# Patient Record
Sex: Male | Born: 1970 | Race: White | Hispanic: No | Marital: Single | Smoking: Current every day smoker
Health system: Southern US, Community
[De-identification: ages and names within clinical notes are randomized; demographics above are authoritative.]

## PROBLEM LIST (undated history)

## (undated) DIAGNOSIS — E663 Overweight: Secondary | ICD-10-CM

## (undated) DIAGNOSIS — T7840XA Allergy, unspecified, initial encounter: Secondary | ICD-10-CM

## (undated) DIAGNOSIS — M722 Plantar fascial fibromatosis: Secondary | ICD-10-CM

## (undated) DIAGNOSIS — R7309 Other abnormal glucose: Secondary | ICD-10-CM

## (undated) DIAGNOSIS — N529 Male erectile dysfunction, unspecified: Secondary | ICD-10-CM

## (undated) DIAGNOSIS — J45909 Unspecified asthma, uncomplicated: Secondary | ICD-10-CM

## (undated) DIAGNOSIS — E785 Hyperlipidemia, unspecified: Secondary | ICD-10-CM

## (undated) HISTORY — DX: Hyperlipidemia, unspecified: E78.5

## (undated) HISTORY — DX: Plantar fascial fibromatosis: M72.2

## (undated) HISTORY — DX: Other abnormal glucose: R73.09

## (undated) HISTORY — DX: Overweight: E66.3

## (undated) HISTORY — DX: Male erectile dysfunction, unspecified: N52.9

## (undated) HISTORY — DX: Allergy, unspecified, initial encounter: T78.40XA

## (undated) HISTORY — DX: Unspecified asthma, uncomplicated: J45.909

---

## 2010-10-26 DIAGNOSIS — M7711 Lateral epicondylitis, right elbow: Secondary | ICD-10-CM

## 2010-10-26 HISTORY — DX: Lateral epicondylitis, right elbow: M77.11

## 2016-10-26 HISTORY — PX: BACK SURGERY: SHX140

## 2017-03-24 ENCOUNTER — Ambulatory Visit
Admission: RE | Admit: 2017-03-24 | Discharge: 2017-03-24 | Disposition: A | Discharge status: Home / Self Care | Payer: BLUE CROSS/BLUE SHIELD | Source: Ambulatory Visit | Attending: Family Medicine | Admitting: Family Medicine

## 2017-03-24 ENCOUNTER — Other Ambulatory Visit: Payer: Self-pay | Admitting: Family Medicine

## 2017-03-24 DIAGNOSIS — I700 Atherosclerosis of aorta: Secondary | ICD-10-CM | POA: Diagnosis not present

## 2017-03-24 DIAGNOSIS — R52 Pain, unspecified: Secondary | ICD-10-CM

## 2017-03-24 DIAGNOSIS — M5136 Other intervertebral disc degeneration, lumbar region: Secondary | ICD-10-CM | POA: Insufficient documentation

## 2017-03-24 DIAGNOSIS — M5441 Lumbago with sciatica, right side: Secondary | ICD-10-CM | POA: Diagnosis present

## 2017-03-31 ENCOUNTER — Ambulatory Visit: Payer: BLUE CROSS/BLUE SHIELD | Attending: Family Medicine | Admitting: Physical Therapy

## 2017-03-31 ENCOUNTER — Encounter: Payer: Self-pay | Admitting: Physical Therapy

## 2017-03-31 ENCOUNTER — Ambulatory Visit: Payer: BLUE CROSS/BLUE SHIELD | Admitting: Physical Therapy

## 2017-03-31 DIAGNOSIS — M6281 Muscle weakness (generalized): Secondary | ICD-10-CM | POA: Diagnosis present

## 2017-03-31 DIAGNOSIS — M5441 Lumbago with sciatica, right side: Secondary | ICD-10-CM | POA: Insufficient documentation

## 2017-03-31 DIAGNOSIS — R262 Difficulty in walking, not elsewhere classified: Secondary | ICD-10-CM | POA: Diagnosis present

## 2017-04-01 NOTE — Therapy (Signed)
[facility]INE [address], [place], [address] Phone: [phone]   Fax:  [phone]  Physical Therapy Treatment  Patient Details  Name: Samuel Stevenson MRN: [id number] Date of Birth: [birth year] Referring Provider: Phineas Real MD  Encounter Date: 03/31/2017      PT End of Session - 03/31/17 1830    Visit Number 1   Number of Visits 12   Date for PT Re-Evaluation 05/12/17   PT Start Time 1750   PT Stop Time 1905   PT Time Calculation (min) 75 min   Activity Tolerance Patient tolerated treatment well;Patient limited by pain   Behavior During Therapy [facility] for tasks assessed/performed      Past Medical History:  Diagnosis Date  . Allergy   . Asthma     History reviewed. No pertinent surgical history.  There were no vitals filed for this visit.      Subjective Assessment - 03/31/17 1809    Subjective He reports feeling numbness right foot, muscle spasms in right buttock, posterior thigh and calf muslces. He is unable to sit for any length of time due to pain in right LE, hip, buttock. pain is worsening since initial onset.    Pertinent History bent to left side this past week, 03/29/17 and felt pain/spasm in right buttock. Patient reports he began with 03/05/2017 he sneezed while turning to the right, back seized up. went to chiropractor and was able to work the rest of the day. Did yard work that weekend and began getting worse Sunday evening. Saw chiro that Monday AM and went to work and Tuesday 5/15 was hurting, went to city nurse and then to chiropracter. He was given muscle relaxers, took off Wed. and Thursday because of increased pain. lightning bolt down right leg 5/18 on waking while lying on back. bed is mattress and firm. He called nurse and was seen by chiropracter and called nurse and was then put on prednisone x 9 days. initially it seemed to help and he was oow the next week . He then was put on  methacarbamol, 03/23/17.    Limitations Sitting;House hold activities;Other (comment);Standing  bending, working, driving   How long can you sit comfortably? unable to sit at all   How long can you stand comfortably? only unweighting right LE   How long can you walk comfortably? not comfortably   Diagnostic tests X rays negative   Patient Stated Goals no pain in right LE   Currently in Pain? Yes   Pain Score 5    Pain Location Leg   Pain Orientation Right   Pain Descriptors / Indicators Aching;Spasm;Cramping   Pain Type Acute pain   Pain Onset 1 to 4 weeks ago   Pain Frequency Constant   Aggravating Factors  being still, sitting, standing   Pain Relieving Factors ice   Effect of Pain on Daily Activities limits all daily activities            [facility] PT Assessment - 03/31/17 2357      Assessment   Medical Diagnosis back pain with right sided sciatica   Referring Provider Robinowitz, Zeb Comfort MD   Onset Date/Surgical Date 03/05/17   Hand Dominance Right   Prior Therapy none     Precautions   Precautions None     Restrictions   Weight Bearing Restrictions No     Balance Screen   Has the patient fallen in the past 6 months  No   Has the patient had a decrease in activity level because of a fear of falling?  No   Is the patient reluctant to leave their home because of a fear of falling?  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, yard work, Chemical engineer   Overall Cognitive Status Within Functional Limits for tasks assessed     Observation/Other Assessments   Modified [place] 62%      Sensation   Light Touch Impaired by gross assessment  right foot great toe per patient report     ROM / Strength   AROM / PROM / Strength Strength     Strength   Overall Strength Within functional limits for tasks performed  bilateral LE's major muscle groups WFL tested in sitting     Palpation   Palpation comment spasms palpable right lumbar paraspinal muscles     Special Tests   Lumbar Tests Slump Test     Slump test   Findings Positive   Side Right   Comment reproduced pain rigth LE with knee extension      Objective: Gait: antalgic, slow cadence, short step length bilateral Sitting; posture: sitting on left hip with right LE extended  AROM: lumbar spine: 25% limited flexion with increased pain in lower lumbar spine and difficulty returning to upright posture/position; extension minimal decreased ROM with increased pain end range Position preference extension: patient positioned prone over 2 pillows with result of centralization of symptoms to right lower back, L5-S1 region, ice massage performed to right lower back x ~3 min. With decreased pain reported. Prone on elbows with no increased symptoms; patient performed hip extension right LE with increased pain right lower back, decreased with continued prone lying for additional 3-5 min. Instructed in proper prone to side lying to sit and how to get in/out of car and position with lumbar support to decrease symptoms           PT Education - 03/31/17 1900    Education provided Yes   Education Details POC, posture correction for sitting using lumbar support; positioning to reduce back and right LE symptoms; prone progression; posture awareness   Person(s) Educated Patient   Methods Explanation;Demonstration;Verbal cues   Comprehension Verbalized understanding;Returned demonstration;Verbal cues required             PT Long Term Goals - 03/31/17 1915      PT LONG TERM GOAL #1   Title Patient will demonstrate improved  function with daily tasks and decreased back pain as indicated by MODI score 30% or less  by 05/12/2017   Baseline MODI 62%   Status New     PT LONG TERM GOAL #2   Title Patient will demonstrate improved posture awareness and pain control strategies to allow patient to sit/stand for >30 min. demonstrating improved function for home, work related tasks including driving by 4/69/6295    Baseline unable to sit any length of time without increased right LE symptoms/pain   Status New     PT LONG TERM  GOAL #3   Title Patient will be independent with home program for posture awareness, pain control, progressive exercises by 05/12/2017 to allow patient to transition to self management once discharged from physical therapy   Baseline requires instruciton, guidance and VC   Status New               Plan - 03/31/17 1912    Clinical Impression Statement Patient is a 46 year old male who presents with acute back pain with righ tsided sciatica. He has limitaitons with sitting, driving and bending/flexion activities that limit funtion with all daily tasks at home and is limiting abiltiy to perform personal care and work related activities. He has no knowledge of appriorate pain control strategies, exercises or progression to assist with improving funciton. he may benefit from physical therpay interveniton to assist with pain control and to improve function as he waits for further assessment, MRI.    History and Personal Factors relevant to plan of care: initial onset of pain 03/05/17 with worsening pain from back with radiating symptoms into right LE over the past 3 weeks. unable to sit, perform personal care, sleep or work due to pain/symptoms.    Clinical Presentation Evolving   Clinical Presentation due to: initally had back pain now radiating into right LE with numbness right foot   Clinical Decision Making Low   Rehab Potential Good   Clinical Impairments Affecting Rehab Potential (+)age, acute  condition, active lifestyle, motivated (-) progressive symptoms with worsening and changing symptoms dialy   PT Frequency 2x / week   PT Duration 6 weeks   PT Treatment/Interventions Iontophoresis 4mg /ml Dexamethasone;Electrical Stimulation;Cryotherapy;Moist Heat;Ultrasound;Patient/family education;Neuromuscular re-education;Therapeutic exercise;Manual techniques   PT Next Visit Plan pain control, progress positioning, exercises as tolerated   PT Home Exercise Plan posture awareness, posiitoning, pain control   Consulted and Agree with Plan of Care Patient      Patient will benefit from skilled therapeutic intervention in order to improve the following deficits and impairments:  Pain, Impaired perceived functional ability, Decreased activity tolerance, Increased muscle spasms, Difficulty walking, Decreased range of motion  Visit Diagnosis: Acute right-sided low back pain with right-sided sciatica - Plan: PT plan of care cert/re-cert  Muscle weakness (generalized) - Plan: PT plan of care cert/re-cert  Difficulty in walking, not elsewhere classified - Plan: PT plan of care cert/re-cert     Problem List There are no active problems to display for this patient.   Ado, Gorelik PT 04/02/2017, 1:01 AM  [place] [facility] PHYSICAL AND SPORTS MEDICINE [address] [place], [address] Phone: [phone]   Fax:  [phone]  Name: Samuel Stevenson MRN: [id number] Date of Birth: [birth year]

## 2017-04-05 ENCOUNTER — Ambulatory Visit: Payer: BLUE CROSS/BLUE SHIELD | Admitting: Physical Therapy

## 2017-04-05 ENCOUNTER — Encounter: Payer: Self-pay | Admitting: Physical Therapy

## 2017-04-05 DIAGNOSIS — M5441 Lumbago with sciatica, right side: Secondary | ICD-10-CM | POA: Diagnosis not present

## 2017-04-05 DIAGNOSIS — R262 Difficulty in walking, not elsewhere classified: Secondary | ICD-10-CM

## 2017-04-05 DIAGNOSIS — M6281 Muscle weakness (generalized): Secondary | ICD-10-CM

## 2017-04-05 NOTE — Therapy (Signed)
[facility] [address] [place], [place], [address] Phone: [phone]   Fax:  [phone]  Physical Therapy Treatment  Patient Details  Name: Samuel Stevenson MRN: [id number] Date of Birth: [birth year] Referring Provider: Phineas Realobinowitz, Joseph H. MD  Encounter Date: 04/05/2017      PT End of Session - 04/05/17 1713    Visit Number 2   Number of Visits 12   Date for PT Re-Evaluation 05/12/17   PT Start Time 1646   PT Stop Time 1722   PT Time Calculation (min) 36 min   Activity Tolerance Patient tolerated treatment well;Patient limited by pain   Behavior [facility] Therapy [facility] for tasks assessed/performed      Past Medical History:  Diagnosis Date  . Allergy   . Asthma     History reviewed. No pertinent surgical history.  There were no vitals filed for this visit.      Subjective Assessment - 04/05/17 1652    Subjective Patient reports having increased pain last week and is working as able with back and right LE pain. He continues with numb like feeling in right foot/toes. He reports intermittent symptoms of pain into right LE to calf.    Pertinent History bent to left side this past week, 03/29/17 and felt pain/spasm in right buttock. Patient reports he began with 03/05/2017 he sneezed while turning to the right, back seized up. went to chiropractor and was able to work the rest of the day. Did yard work that weekend and began getting worse Sunday evening. Saw chiro that Monday AM and went to work and Tuesday 5/15 was hurting, went to city nurse and then to chiropracter. He was given muscle relaxers, took off Wed. and Thursday because of increased pain. lightning bolt down right leg 5/18 on waking while lying on back. bed is mattress and firm. He called nurse and was seen by chiropracter and called nurse and was then put on prednisone x 9 days. initially it seemed to help and he was oow the next week . He then was put on  methacarbamol, 03/23/17.    Limitations Sitting;House hold activities;Other (comment);Standing  bending, working, driving   How long can you sit comfortably? unable to sit at all   How long can you stand comfortably? only unweighting right LE   How long can you walk comfortably? not comfortably   Diagnostic tests X rays negative   Patient Stated Goals no pain in right LE   Currently in Pain? Yes   Pain Score 4    Pain Location Buttocks   Pain Orientation Right   Pain Descriptors / Indicators Aching   Pain Type Acute pain   Pain Onset 1 to 4 weeks ago   Pain Frequency Constant      Objective: Gait: antalgic, slow cadence, decreased trunk rotation Palpation: increased warmth over right side lower lumbar L4-5 region, increased hyper sensitivity to palpation right side lower back just superior to gluteal region Posture: guarded  Treatment: Modalities: 20 min. Electrical stimulation: High volt estim.clincial program for muscle spasms (4) electrodes applied to lumbar spine  intensity to tolerance with patient in prone with (1) pillow under abdomen/pelvis and LE's supported by pillow, ice pack applied to lower back in conjunction with estim. (no adverse reactions noted): goal: pain, spasms  Patient performed prone on elbows x 2 min. Following estim. Without increased LE symptoms; patient transferred on/off treatment table with cautious controlled movement with VC for correct posture  to avoid flexion of trunk  Patient response to treatment: decreased soreness by 30% in lumbar spine with ice/estim. Treatment and was able to progress to prone on elbows with less difficulty and no increase in LE symptoms of lower back symptoms. He continued with increased warmth to right of L4-5 even following ice treatment.            PT Education - 04/05/17 1715    Education provided Yes   Education Details re assessed posture, body mechanics for sitting, standing, transfers in/out of car    Person(s)  Educated Patient   Methods Explanation   Comprehension Verbalized understanding             PT Long Term Goals - 03/31/17 1915      PT LONG TERM GOAL #1   Title Patient will demonstrate improved function with daily tasks and decreased back pain as indicated by MODI score 30% or less  by 05/12/2017   Baseline MODI 62%   Status New     PT LONG TERM GOAL #2   Title Patient will demonstrate improved posture awareness and pain control strategies to allow patient to sit/stand for >30 min. demonstrating improved function for home, work related tasks including driving by 5/78/4696    Baseline unable to sit any length of time without increased right LE symptoms/pain   Status New     PT LONG TERM GOAL #3   Title Patient will be independent with home program for posture awareness, pain control, progressive exercises by 05/12/2017 to allow patient to transition to self management once discharged from physical therapy   Baseline requires instruciton, guidance and VC   Status New               Plan - 04/05/17 1714    Clinical Impression Statement Focus of treatment on pain control. Patient demonstrates improvement with centralizing pain from right LE to buttock and lower back however he still has pain radiating down right leg to his calf intermittently and he continues with numblike feeling in his foot.    Rehab Potential Good   Clinical Impairments Affecting Rehab Potential (+)age, acute condition, active lifestyle, motivated (-) progressive symptoms with worsening and changing symptoms dialy   PT Frequency 2x / week   PT Duration 6 weeks   PT Treatment/Interventions Iontophoresis 4mg /ml Dexamethasone;Electrical Stimulation;Cryotherapy;Moist Heat;Ultrasound;Patient/family education;Neuromuscular re-education;Therapeutic exercise;Manual techniques   PT Next Visit Plan pain control, progress positioning, exercises as tolerated   PT Home Exercise Plan posture awareness, posiitoning, pain  control      Patient will benefit from skilled therapeutic intervention in order to improve the following deficits and impairments:  Pain, Impaired perceived functional ability, Decreased activity tolerance, Increased muscle spasms, Difficulty walking, Decreased range of motion  Visit Diagnosis: Acute right-sided low back pain with right-sided sciatica  Muscle weakness (generalized)  Difficulty in walking, not elsewhere classified     Problem List There are no active problems to display for this patient.   Tirth, Cothron PT 04/06/2017, 1:19 PM  [place] [facility] PHYSICAL AND SPORTS MEDICINE [address], [place], [address] Phone: [phone]   Fax:  [phone]  Name: Samuel Stevenson MRN: [id number] Date of Birth: May 17, 1971

## 2017-04-06 ENCOUNTER — Encounter: Payer: BLUE CROSS/BLUE SHIELD | Admitting: Physical Therapy

## 2017-04-07 ENCOUNTER — Ambulatory Visit: Payer: BLUE CROSS/BLUE SHIELD | Admitting: Physical Therapy

## 2017-04-12 ENCOUNTER — Encounter: Payer: BLUE CROSS/BLUE SHIELD | Admitting: Physical Therapy

## 2017-04-12 ENCOUNTER — Ambulatory Visit: Payer: BLUE CROSS/BLUE SHIELD | Admitting: Physical Therapy

## 2017-04-13 ENCOUNTER — Encounter: Payer: BLUE CROSS/BLUE SHIELD | Admitting: Physical Therapy

## 2017-04-14 ENCOUNTER — Ambulatory Visit: Payer: BLUE CROSS/BLUE SHIELD | Admitting: Physical Therapy

## 2017-04-14 ENCOUNTER — Encounter: Payer: BLUE CROSS/BLUE SHIELD | Admitting: Physical Therapy

## 2017-04-15 ENCOUNTER — Ambulatory Visit: Payer: BLUE CROSS/BLUE SHIELD | Admitting: Physical Therapy

## 2017-04-19 ENCOUNTER — Ambulatory Visit: Payer: BLUE CROSS/BLUE SHIELD | Admitting: Physical Therapy

## 2017-04-19 ENCOUNTER — Encounter: Payer: BLUE CROSS/BLUE SHIELD | Admitting: Physical Therapy

## 2017-04-20 ENCOUNTER — Encounter: Payer: BLUE CROSS/BLUE SHIELD | Admitting: Physical Therapy

## 2017-04-21 ENCOUNTER — Encounter: Payer: BLUE CROSS/BLUE SHIELD | Admitting: Physical Therapy

## 2017-04-21 ENCOUNTER — Ambulatory Visit: Payer: BLUE CROSS/BLUE SHIELD | Admitting: Physical Therapy

## 2017-04-22 ENCOUNTER — Encounter: Payer: BLUE CROSS/BLUE SHIELD | Admitting: Physical Therapy

## 2017-04-26 ENCOUNTER — Ambulatory Visit: Payer: BLUE CROSS/BLUE SHIELD | Admitting: Physical Therapy

## 2017-04-26 ENCOUNTER — Encounter: Payer: BLUE CROSS/BLUE SHIELD | Admitting: Physical Therapy

## 2017-04-29 ENCOUNTER — Encounter: Payer: BLUE CROSS/BLUE SHIELD | Admitting: Physical Therapy

## 2018-09-05 ENCOUNTER — Ambulatory Visit: Payer: BLUE CROSS/BLUE SHIELD | Admitting: Podiatry

## 2019-04-11 ENCOUNTER — Other Ambulatory Visit: Payer: Self-pay

## 2019-04-12 MED ORDER — SILDENAFIL CITRATE 20 MG PO TABS: ORAL_TABLET | ORAL | 5 refills | Status: DC

## 2019-05-15 DIAGNOSIS — I788 Other diseases of capillaries: Secondary | ICD-10-CM | POA: Diagnosis not present

## 2019-05-15 DIAGNOSIS — D2262 Melanocytic nevi of left upper limb, including shoulder: Secondary | ICD-10-CM | POA: Diagnosis not present

## 2019-05-15 DIAGNOSIS — L821 Other seborrheic keratosis: Secondary | ICD-10-CM | POA: Diagnosis not present

## 2019-05-15 DIAGNOSIS — D2272 Melanocytic nevi of left lower limb, including hip: Secondary | ICD-10-CM | POA: Diagnosis not present

## 2019-05-15 DIAGNOSIS — D2271 Melanocytic nevi of right lower limb, including hip: Secondary | ICD-10-CM | POA: Diagnosis not present

## 2019-05-15 DIAGNOSIS — D225 Melanocytic nevi of trunk: Secondary | ICD-10-CM | POA: Diagnosis not present

## 2019-05-15 DIAGNOSIS — D2261 Melanocytic nevi of right upper limb, including shoulder: Secondary | ICD-10-CM | POA: Diagnosis not present

## 2019-05-15 DIAGNOSIS — D485 Neoplasm of uncertain behavior of skin: Secondary | ICD-10-CM | POA: Diagnosis not present

## 2019-06-19 DIAGNOSIS — M9903 Segmental and somatic dysfunction of lumbar region: Secondary | ICD-10-CM | POA: Diagnosis not present

## 2019-06-19 DIAGNOSIS — M5386 Other specified dorsopathies, lumbar region: Secondary | ICD-10-CM | POA: Diagnosis not present

## 2019-09-13 ENCOUNTER — Other Ambulatory Visit: Payer: Self-pay

## 2019-09-13 ENCOUNTER — Ambulatory Visit: Payer: Self-pay

## 2019-09-13 DIAGNOSIS — Z0000 Encounter for general adult medical examination without abnormal findings: Secondary | ICD-10-CM

## 2019-09-13 LAB — POCT URINALYSIS DIPSTICK
Bilirubin, UA: NEGATIVE
Blood, UA: NEGATIVE
Glucose, UA: NEGATIVE
Ketones, UA: NEGATIVE
Leukocytes, UA: NEGATIVE
Nitrite, UA: NEGATIVE
Protein, UA: NEGATIVE
Spec Grav, UA: 1.015 (ref 1.010–1.025)
Urobilinogen, UA: 0.2 E.U./dL
pH, UA: 6 (ref 5.0–8.0)

## 2019-09-13 NOTE — Progress Notes (Signed)
Gerarda Fraction, NP-C (Interim Provider) listed as ordering provider for labs because she was working the day Samuel Stevenson came in for Pre-procedural before annual physical.  Physical is scheduled for  09/27/2019, but we don't have a December 2020 Provider Schedule & don't know who he will be seeing.  AMD

## 2019-09-14 LAB — CMP12+LP+TP+TSH+6AC+PSA+CBC…
AST: 53 IU/L — ABNORMAL HIGH (ref 0–40)
Albumin/Globulin Ratio: 2.7 — ABNORMAL HIGH (ref 1.2–2.2)
Albumin: 5.1 g/dL — ABNORMAL HIGH (ref 4.0–5.0)
BUN/Creatinine Ratio: 9 (ref 9–20)
Basophils Absolute: 0.1 10*3/uL (ref 0.0–0.2)
Bilirubin Total: 0.5 mg/dL (ref 0.0–1.2)
Chloride: 100 mmol/L (ref 96–106)
Chol/HDL Ratio: 2 ratio (ref 0.0–5.0)
Cholesterol, Total: 192 mg/dL (ref 100–199)
Estimated CHD Risk: <0.5 times avg. (ref 0.0–1.0)
Free Thyroxine Index: 1.6 (ref 1.2–4.9)
Globulin, Total: 1.9 g/dL (ref 1.5–4.5)
Glucose: 83 mg/dL (ref 65–99)
HDL: 97 mg/dL (ref >39)
Hematocrit: 46.2 % (ref 37.5–51.0)
Immature Grans (Abs): 0 10*3/uL (ref 0.0–0.1)
Immature Granulocytes: 0 %
LDH: 266 IU/L — ABNORMAL HIGH (ref 121–224)
Lymphocytes Absolute: 1.5 10*3/uL (ref 0.7–3.1)
Lymphs: 19 %
MCH: 33.1 pg — ABNORMAL HIGH (ref 26.6–33.0)
MCHC: 33.5 g/dL (ref 31.5–35.7)
MCV: 99 fL — ABNORMAL HIGH (ref 79–97)
Phosphorus: 3.4 mg/dL (ref 2.8–4.1)
Prostate Specific Ag, Serum: 0.6 ng/mL (ref 0.0–4.0)
RDW: 12.5 % (ref 11.6–15.4)
Sodium: 140 mmol/L (ref 134–144)
TSH: 1.8 u[IU]/mL (ref 0.450–4.500)
Total Protein: 7 g/dL (ref 6.0–8.5)
Uric Acid: 5.3 mg/dL (ref 3.7–8.6)
WBC: 7.6 10*3/uL (ref 3.4–10.8)

## 2019-09-14 LAB — CMP12+LP+TP+TSH+6AC+PSA+CBC?
ALT: 49 IU/L — ABNORMAL HIGH (ref 0–44)
Alkaline Phosphatase: 55 IU/L (ref 39–117)
BUN: 6 mg/dL (ref 6–24)
Basos: 1 %
Calcium: 9.3 mg/dL (ref 8.7–10.2)
Creatinine, Ser: 0.65 mg/dL — ABNORMAL LOW (ref 0.76–1.27)
EOS (ABSOLUTE): 0.2 10*3/uL (ref 0.0–0.4)
Eos: 2 %
GFR calc Af Amer: 131 mL/min/{1.73_m2} (ref >59)
GFR calc non Af Amer: 113 mL/min/{1.73_m2} (ref >59)
GGT: 44 IU/L (ref 0–65)
Hemoglobin: 15.5 g/dL (ref 13.0–17.7)
Iron: 100 ug/dL (ref 38–169)
LDL Chol Calc (NIH): 86 mg/dL (ref 0–99)
Monocytes Absolute: 0.5 10*3/uL (ref 0.1–0.9)
Monocytes: 6 %
Neutrophils Absolute: 5.5 10*3/uL (ref 1.4–7.0)
Neutrophils: 72 %
Platelets: 205 10*3/uL (ref 150–450)
Potassium: 4.3 mmol/L (ref 3.5–5.2)
RBC: 4.68 x10E6/uL (ref 4.14–5.80)
T3 Uptake Ratio: 29 % (ref 24–39)
T4, Total: 5.4 ug/dL (ref 4.5–12.0)
Triglycerides: 44 mg/dL (ref 0–149)
VLDL Cholesterol Cal: 9 mg/dL (ref 5–40)

## 2019-09-20 ENCOUNTER — Ambulatory Visit: Payer: Self-pay

## 2019-09-27 ENCOUNTER — Other Ambulatory Visit: Payer: Self-pay

## 2019-09-27 ENCOUNTER — Encounter: Payer: Self-pay | Admitting: Registered Nurse

## 2019-09-27 ENCOUNTER — Ambulatory Visit: Payer: Self-pay | Admitting: Registered Nurse

## 2019-09-27 VITALS — BP 150/78 | HR 80 | Temp 99.3°F | Ht 71.0 in | Wt 205.0 lb

## 2019-09-27 DIAGNOSIS — R7989 Other specified abnormal findings of blood chemistry: Secondary | ICD-10-CM

## 2019-09-27 DIAGNOSIS — M7711 Lateral epicondylitis, right elbow: Secondary | ICD-10-CM

## 2019-09-27 DIAGNOSIS — M7712 Lateral epicondylitis, left elbow: Secondary | ICD-10-CM

## 2019-09-27 DIAGNOSIS — Z6828 Body mass index (BMI) 28.0-28.9, adult: Secondary | ICD-10-CM

## 2019-09-27 DIAGNOSIS — L42 Pityriasis rosea: Secondary | ICD-10-CM

## 2019-09-27 DIAGNOSIS — Z0000 Encounter for general adult medical examination without abnormal findings: Secondary | ICD-10-CM

## 2019-09-27 NOTE — Progress Notes (Signed)
Lost 10 lbs since last physical.

## 2019-09-27 NOTE — Progress Notes (Signed)
Subjective:    Patient ID: Samuel Stevenson, male    DOB: 1971-02-14, 48 y.o.   MRN: [id number]  48y/o caucasian male established patient here for annual physical.  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  Lost 10 lbs in previous year.  Has noticed especially at night bilateral forearm pain with twisting motions/making fist.  Also during the summer having calf cramps during the day and at night.  Patient reported he is alternating water with gatorade and it helps some.  Not happening as often in the past month-rare not daily.  Saw [facility] provider for rash on trunk and told he had pityriasis rosea with herald patch started right hip.  Stated plantar fasciitis has resolved.  Hasn't required mobic daily for back pain anymore and typically aleve 1-2 tabs prn now but has a few left at home to use for bad days doesn't take aleve if he is taking mobic He is aware there are in the same family and shouldn't be taken in the same day.  Denied loss of bowel/bladder control, saddle paresthesias/arm/leg weakness.     Review of Systems  Constitutional: Positive for activity change. Negative for appetite change, chills, diaphoresis, fatigue and fever.  HENT: Negative for trouble swallowing and voice change.   Eyes: Negative for photophobia and visual disturbance.  Respiratory: Negative for cough, choking, chest tightness, shortness of breath, wheezing and stridor.   Cardiovascular: Negative for chest pain, palpitations and leg swelling.  Gastrointestinal: Negative for abdominal distention, abdominal pain, anal bleeding, blood in stool, constipation, diarrhea, nausea and vomiting.  Endocrine: Negative for cold intolerance and heat intolerance.  Genitourinary: Negative for difficulty urinating.  Musculoskeletal: Positive for back pain and myalgias. Negative for arthralgias, gait problem, joint swelling, neck pain and neck stiffness.    Skin: Positive for rash. Negative for color change, pallor and wound.  Allergic/Immunologic: Negative for environmental allergies and food allergies.  Neurological: Positive for weakness. Negative for dizziness, tremors, seizures, syncope, facial asymmetry, speech difficulty, light-headedness, numbness and headaches.  Hematological: Negative for adenopathy. Does not bruise/bleed easily.  Psychiatric/Behavioral: Positive for sleep disturbance. Negative for agitation and confusion.       Objective:   Physical Exam Vitals signs and nursing note reviewed.  Constitutional:      General: He is awake. He is not in acute distress.    Appearance: Normal appearance. He is well-developed, well-groomed and overweight. He is not ill-appearing, toxic-appearing or diaphoretic.  HENT:     Head: Normocephalic and atraumatic.     Jaw: There is normal jaw occlusion.     Salivary Glands: Right salivary gland is not diffusely enlarged or tender. Left salivary gland is not diffusely enlarged or tender.     Right Ear: Hearing, tympanic membrane, ear canal and external ear normal. There is no impacted cerumen.     Left Ear: Hearing, tympanic membrane, ear canal and external ear normal. There is no impacted cerumen.     Nose: Nose normal. No congestion or rhinorrhea.     Right Sinus: No maxillary sinus tenderness or frontal sinus tenderness.     Left Sinus: No maxillary sinus tenderness or frontal sinus tenderness.     Mouth/Throat:     Lips: Pink. No lesions.     Mouth: Mucous membranes are moist.     Dentition: No gum lesions.     Tongue: No lesions. Tongue does not deviate from midline.  Palate: No mass and lesions.     Pharynx: Oropharynx is clear. Uvula midline. No pharyngeal swelling, oropharyngeal exudate, posterior oropharyngeal erythema or uvula swelling.  Eyes:     General: Lids are normal. Vision grossly intact. Gaze aligned appropriately. No allergic shiner, visual field deficit or scleral  icterus.       Right eye: No discharge.        Left eye: No discharge.     Extraocular Movements: Extraocular movements intact.     Conjunctiva/sclera: Conjunctivae normal.     Pupils: Pupils are equal, round, and reactive to light.  Neck:     Musculoskeletal: Normal range of motion and neck supple. Normal range of motion. No edema, erythema, neck rigidity, crepitus, injury, pain with movement, torticollis, spinous process tenderness or muscular tenderness.     Thyroid: No thyroid mass, thyromegaly or thyroid tenderness.     Vascular: No carotid bruit.     Trachea: Trachea normal.  Cardiovascular:     Rate and Rhythm: Normal rate and regular rhythm.     Pulses: Normal pulses.          Radial pulses are 2+ on the right side and 2+ on the left side.     Heart sounds: Normal heart sounds.  Pulmonary:     Effort: Pulmonary effort is normal. No respiratory distress.     Breath sounds: Normal breath sounds and air entry. No stridor, decreased air movement or transmitted upper airway sounds. No decreased breath sounds, wheezing, rhonchi or rales.     Comments: No cough observed in exam room; spoke full sentences without difficulty; wearing cloth mask due to covid 19 pandemic Abdominal:     General: Abdomen is flat. Bowel sounds are normal. There is no distension.     Palpations: Abdomen is soft. There is no mass.     Tenderness: There is no abdominal tenderness. There is no right CVA tenderness, left CVA tenderness, guarding or rebound.     Hernia: No hernia is present.     Comments: Dull to percussion x 4 quads; hypoactive bowel sounds  Musculoskeletal: Normal range of motion.        General: No swelling, tenderness, deformity or signs of injury.     Right lower leg: No edema.     Left lower leg: No edema.     Comments: nontender bilateral forearms patient reported slight pain with supination left forearm normal AROM fingers/wrists/elbow/shoulder/c-spine/t-spine  Lymphadenopathy:      Cervical: No cervical adenopathy.     Right cervical: No superficial cervical adenopathy.    Left cervical: No superficial cervical adenopathy.  Skin:    General: Skin is warm and dry.     Capillary Refill: Capillary refill takes less than 2 seconds.     Coloration: Skin is not ashen, cyanotic, jaundiced, mottled, pale or sallow.     Findings: Rash present. No abrasion, abscess, acne, bruising, burn, ecchymosis, erythema, laceration, lesion, petechiae or wound. Rash is macular and scaling. Rash is not crusting, nodular, papular, purpuric, pustular, urticarial or vesicular.     Nails: There is no clubbing.           Comments: Scattered Pink salmon lesions on trunk anterior and posterior dry fine scale noted  Neurological:     General: No focal deficit present.     Mental Status: He is alert and oriented to person, place, and time. Mental status is at baseline.     Cranial Nerves: No cranial nerve deficit.  Sensory: No sensory deficit.     Motor: No weakness.     Coordination: Coordination normal.     Gait: Gait normal.  Psychiatric:        Mood and Affect: Mood normal.        Speech: Speech normal.        Behavior: Behavior normal. Behavior is cooperative.        Thought Content: Thought content normal.        Judgment: Judgment normal.       Reviewed lab results below with patient refused printout and compared to previous 4 years paper chart [facility] lipids greatly improved; previous history of elevated LFTs/MCV/MCH that resolves and returns discussed probably related to alcohol intake (absorption/nutritional deficiencies can occurs B vitamins needed to make RBCs) and NSAID use; repeat CBC/LFTs in 3 months nonfasting decrease alcohol intake EKG reviewed sinus rhythm stable Glucose 65 - 99 mg/dL 83   Uric Acid 3.7 - 8.6 mg/dL 5.3   Comment:      Therapeutic target for gout patients: <6.0  BUN 6 - 24 mg/dL 6   Creatinine, Ser 9.620.76 - 1.27 mg/dL 9.52WUX0.65Low    GFR calc non Af Amer  >59 mL/min/1.73 113   GFR calc Af Amer >59 mL/min/1.73 131   BUN/Creatinine Ratio 9 - 20 9   Sodium 134 - 144 mmol/L 140   Potassium 3.5 - 5.2 mmol/L 4.3   Chloride 96 - 106 mmol/L 100   Calcium 8.7 - 10.2 mg/dL 9.3   Phosphorus 2.8 - 4.1 mg/dL 3.4   Total Protein 6.0 - 8.5 g/dL 7.0   Albumin 4.0 - 5.0 g/dL 3.2GMWN5.1High    Globulin, Total 1.5 - 4.5 g/dL 1.9   Albumin/Globulin Ratio 1.2 - 2.2 2.7High    Bilirubin Total 0.0 - 1.2 mg/dL 0.5   Alkaline Phosphatase 39 - 117 IU/L 55   LDH 121 - 224 IU/L 266High    AST 0 - 40 IU/L 53High    ALT 0 - 44 IU/L 49High    GGT 0 - 65 IU/L 44   Iron 38 - 169 ug/dL 027100   Cholesterol, Total 100 - 199 mg/dL 253192   Triglycerides 0 - 149 mg/dL 44   HDL >66>39 mg/dL 97   VLDL Cholesterol Cal 5 - 40 mg/dL 9   LDL Chol Calc (NIH) 0 - 99 mg/dL 86   Chol/HDL Ratio 0.0 - 5.0 ratio 2.0   Comment:                 T. Chol/HDL Ratio                        Men Women                 1/2 Avg.Risk 3.4  3.3                   Avg.Risk 5.0  4.4                 2X Avg.Risk 9.6  7.1                 3X Avg.Risk 23.4  11.0   Estimated CHD Risk 0.0 - 1.0 times avg. < 0.5   Comment: The CHD Risk is based on the T. Chol/HDL ratio. Other  factors affect CHD Risk such as hypertension, smoking,  diabetes, severe obesity, and family history of  premature CHD.   TSH 0.450 - 4.500  uIU/mL 1.800   T4, Total 4.5 - 12.0 ug/dL 5.4   T3 Uptake Ratio 24 - 39 % 29   Free Thyroxine Index 1.2 - 4.9 1.6   Prostate Specific Ag, Serum 0.0 - 4.0 ng/mL 0.6   Comment: Roche ECLIA methodology.  According to the American Urological Association, Serum PSA should  decrease and remain at undetectable levels after radical  prostatectomy. The AUA defines biochemical recurrence as an initial  PSA value 0.2 ng/mL or greater followed by a subsequent confirmatory  PSA value 0.2 ng/mL or  greater.  Values obtained with different assay methods or kits cannot be used  interchangeably. Results cannot be interpreted as absolute evidence  of the presence or absence of malignant disease.   WBC 3.4 - 10.8 x10E3/uL 7.6   RBC 4.14 - 5.80 x10E6/uL 4.68   Hemoglobin 13.0 - 17.7 g/dL 15.5   Hematocrit 37.5 - 51.0 % 46.2   MCV 79 - 97 fL 99High    MCH 26.6 - 33.0 pg 33.1High    MCHC 31.5 - 35.7 g/dL 33.5   RDW 11.6 - 15.4 % 12.5   Platelets 150 - 450 x10E3/uL 205   Neutrophils Not Estab. % 72   Lymphs Not Estab. % 19   Monocytes Not Estab. % 6   Eos Not Estab. % 2   Basos Not Estab. % 1   Neutrophils Absolute 1.4 - 7.0 x10E3/uL 5.5   Lymphocytes Absolute 0.7 - 3.1 x10E3/uL 1.5   Monocytes Absolute 0.1 - 0.9 x10E3/uL 0.5   EOS (ABSOLUTE) 0.0 - 0.4 x10E3/uL 0.2   Basophils Absolute 0.0 - 0.2 x10E3/uL 0.1   Immature Granulocytes Not Estab. % 0   Immature Grans (Abs) 0.0 - 0.1 x10E3/uL 0.0   Resulting Agency  [facility]    Narrative Performed by: [facility] Performed at: [address]  [address] [place], [place] [id number]  Lab Director: Rush Farmer MD, Phone: [phone]    Specimen Collected: 09/13/19 08:48 Last Resulted: 09/14/19 08:13            Other Results from 09/13/2019  Result Notes for POCT Urinalysis Dipstick (CPT 69485)  Notes recorded by Olen Cordial, NP on 09/13/2019 at 9:48 AM EST  Normal negative urinalysis will discuss with patient at appt. No blood, bacteria, protein or sugar noted in urine.  POCT Urinalysis Dipstick (CPT 81002) Order: [id number]  Status:  Final result Visible to patient:  No (not released) Next appt:  None Dx:  Routine adult health maintenance  Ref Range & Units 2wk ago  Color, UA  Yellow   Clarity, UA  Clear   Glucose, UA Negative Negative   Bilirubin, UA  Negative   Ketones, UA  Negative   Spec Grav, UA 1.010 - 1.025 1.015   Blood, UA  Negative   pH, UA 5.0 - 8.0 6.0   Protein, UA Negative Negative    Urobilinogen, UA 0.2 or 1.0 E.U./dL 0.2   Nitrite, UA  Negative   Leukocytes, UA Negative Negative   Appearance    Odor             Assessment & Plan:  A-BMI 28, annual physical, alcohol use, pityriasis rosea, lateral epicondylitis bilateral acute  Strap left fitted and distributed from clinic stock to patient left arm for tennis elbow/lateral epicondylitis.  If strap helping right may return to clinic to obtain strap for right forearm from [place].  exitcare handout on tennis elbow and rehab exercises printed and given to  patient. Repetitive motion injury due landscaping work. Discussed rest, nsaid x 2 weeks, stretching, lateral epicondylitis strap use every day and icing 15 minutes TID. Consider formal Physical Therapy referral if no relief with plan of care. Discussed with patient takes longer to resolve when ongoing for months consider orthopedics and steroid injection if no relief with NSAID, rest, ice, strap.  Naproxen max 500mg  po BID OTC.  Patient refused Rx.  Hydrate, hydrate, hydrate, avoid dehydration when taking NSAID. Follow up if no improvement in symptoms with plan of care. Will consider orthopedics and formal PT if fails conservative treatment. Patient verbalized understanding of instructions, agreed with plan of care and had no further questions at this time.     10 lb weight loss over next year; lost 10 lbs in previous year; decreasing alcohol intake alone will probably result in 10 lb loss.  Annual physical again in 1 year fasting labs. Continue exercise 150 minutes per week and dietary fiber 30 grams per day whole grains/fruits/vegetables and lean dairy/protein. Exitcare handout printed and given on risk factors of being overweight to patient. Patient verbalized understanding information/instructions, agreed with plan of care and had no further questions at this time.  Decrease beer to 1 per night or not every night currently up to 4 every night which exceeds recommended 14  per week--health risks/impact will occur with drinking this may beers and can see now with his elevated liver enzymes. Do not stop drinking cold need to decrease then stop to prevent alcohol withdrawal symptoms/crisis.   Repeat LFTs and CBC in 3 months; discussed aleve can also be irritating to liver.  Weight loss recommended also can help improve/resolve elevated liver enzymes if this is fatty liver related in addition to alcohol intake.  Exitcare handout printed and given to patient on alcohol abuse and nutrition and alcoholic liver disease.  Patient verbalized understanding information/instructions, agreed with plan of care and had no further questions at this time.  Discussed typically related to viral illness.  Patient refused Rx for medium potentcy cortisone cream as he is using claritin po am 10mg  and benadryl 25mg  po qhs prn itching and states working well for him, skin dry apply emollient daily e.g. aquaphor/vaseline/eucerin/lotion without fragrance and not in a pump bottle.  Avoid scratching as this can lead to infection/cellulitis.  Patient given printed exitcare handout pityriasis rosea.  Patient to follow up if rash does not resolve or worsening/changing especially with fever/chills/body aches/purulent discharge.  Patient verbalized understanding information/instructions, agreed with plan of care and had no further questions at this time.

## 2019-09-27 NOTE — Patient Instructions (Signed)
Alcoholic Liver Disease Alcoholic liver disease refers to liver damage that is caused by drinking a lot of alcohol over a long period of time. The liver is an organ that:  Helps to divide (break down) and absorb fats and other nutrients in the blood.  Helps to remove harmful substances from the blood.  Makes the parts of the blood that help to form clots and prevent excessive bleeding. Damage may cause the liver to stop working properly. There are three main types of alcoholic liver disease, and they usually occur in the following order. 1. Fatty liver disease. This is considered the early stage of alcoholic liver disease. It is a condition in which too much fat has built up in the liver cells. In many cases, fatty liver disease does not cause any symptoms. It is often diagnosed when lab tests are done for other reasons. 2. Alcoholic hepatitis. This is liver inflammation that decreases the liver's ability to function normally. 3. Alcoholic cirrhosis. Cirrhosis is long-term (chronic) liver injury. Having cirrhosis means that many healthy liver cells have been replaced by scar tissue. This prevents blood from flowing through the liver, which makes it difficult for the liver to function. Symptoms of this condition usually get worse (progress) over time if alcohol use continues. Treatment requires stopping all alcohol intake. What are the causes? Alcoholic liver disease is caused by long-term (chronic) heavy alcohol use. The liver filters alcohol out of the bloodstream. When alcohol gets broken down in the liver, it releases poisonous (toxic) chemicals that damage liver cells. What increases the risk? This condition is more likely to develop in:  Women.  People who have a family history of alcoholic liver disease.  People who have poor nutrition.  People who are obese.  People who drink a lot of alcohol, especially people who often drink a lot during short periods of time (binge drink).   People who have an underlying liver disease such as hepatitis B or hepatitis C.  People who lack certain nutrients, such as folate or thiamine (have nutrient deficiencies). What are the signs or symptoms? Most people do not have symptoms in the early stages of this disease. If early symptoms do develop, they may include:  Loss of appetite.  Nausea and vomiting. Symptoms of moderate disease include:  Loss of appetite.  Nausea and vomiting.  Diarrhea.  Weight loss without trying.  Fatigue.  Yellowing of the skin and the whites of the eyes (jaundice).  Pain and swelling in the abdomen.  Fever. Symptoms of advanced disease include:  Weight loss and muscle loss.  Itchy skin.  Buildup of fluid in the abdomen (ascites).  Swelling of the feet and ankles (edema).  Nosebleeds or bleeding gums.  Bloody bowel movements.  Enlarged fingertips (clubbing).  Trouble concentrating.  Trouble sleeping.  Mood changes.  Agitation.  Confusion. Some people do not have symptoms until the condition becomes severe. Symptoms often get worse right after a period of heavy drinking. How is this diagnosed? This condition may be diagnosed based on:  A physical exam.  Blood tests.  Tests that create detailed images of the body. These may include: ? Liver ultrasound. ? CT scan. ? MRI.  A liver biopsy. For this test, a small sample of liver tissue is removed and checked for signs of damage. How is this treated? The most important part of treatment is to stop drinking alcohol. If you are addicted to alcohol, your health care provider will help you make a plan to  quit. This plan may involve:  Taking medicine to decrease unpleasant symptoms that are caused by stopping or decreasing alcohol use (withdrawal symptoms).  Entering a treatment program to help you stop drinking.  Joining a support group. Treatment for alcoholic liver disease may also include:  Nutritional therapy. Your  health care provider or a diet and nutrition specialist (dietitian) may recommend: ? Eating a healthy diet. ? Taking vitamins. ? Eating foods that contain a lot of zinc or B vitamins such as folate, thiamine, or pyridoxine.  Steroid medicines to reduce liver inflammation. These may be recommended if your disease is severe.  Receiving a donated liver (liver transplant). This is only done in very severe cases, and only for people who have completely stopped drinking and can commit to never drinking alcohol again. Follow these instructions at home:   Do not drink alcohol. Follow your treatment plan, and work with your health care provider as needed.  Consider joining an alcohol support group. These groups can provide emotional support and guidance.  Take over-the-counter and prescription medicines only as told by your health care provider. These include vitamins and supplements.  Do not use medicines or eat foods that contain alcohol unless told by your health care provider.  Follow instructions from your health care provider or dietitian about eating a healthy diet.  Keep all follow-up visits as told by your health care provider. This is important. Contact a health care provider if:  You develop a fever.  Your skin color becomes more yellow, pale, or dark.  You develop headaches. Get help right away if:  You vomit blood.  You have bright red blood in your stool.  You have black, tarry stools.  You have trouble: ? Thinking. ? Walking. ? Balancing. ? Breathing. Summary  Alcoholic liver disease refers to liver damage that is caused by drinking a lot of alcohol over a long period of time.  Symptoms of this condition usually get worse (progress) over time if alcohol use continues.  Your health care provider will do a physical exam and tests to diagnose your condition.  The most important part of treatment is to stop drinking alcohol. Follow your treatment plan, and work  with your health care provider as needed. This information is not intended to replace advice given to you by your health care provider. Make sure you discuss any questions you have with your health care provider. Document Released: 11/02/2014 Document Revised: 01/31/2019 Document Reviewed: 06/24/2017 Elsevier Patient Education  2020 [employer]. Alcohol Abuse and Nutrition Alcohol abuse is any pattern of alcohol consumption that harms your health, relationships, or work. Alcohol abuse can cause poor nutrition (malnutrition or malnourishment) and a lack of nutrients (nutrient deficiencies), which can lead to more complications. Alcohol abuse brings malnutrition and nutrient deficiencies in two ways:  It causes your liver to work abnormally. This affects how your body divides (breaks down) and absorbs nutrients from food.  It causes you to eat poorly. Many people who abuse alcohol do not eat enough carbohydrates, protein, fat, vitamins, and minerals. Nutrients that are commonly lacking (deficient) in people who abuse alcohol include:  Vitamins. ? Vitamin A. This is needed for your vision, metabolism, and ability to fight off infections (immunity). ? B vitamins. These include folate, thiamine, and niacin. These are needed for new cell growth. ? Vitamin C. This plays an important role in wound healing, immunity, and helping your body to absorb iron. ? Vitamin D. This is necessary for your body to  absorb and use calcium. It is produced by your liver, but you can also get it from food and from sun exposure.  Minerals. ? Calcium. This is needed for healthy bones as well as heart and blood vessel (cardiovascular) function. ? Iron. This is important for blood, muscle, and nervous system functioning. ? Magnesium. This plays an important role in muscle and nerve function, and it helps to control blood sugar and blood pressure. ? Zinc. This is important for the normal functioning of your nervous system  and digestive system (gastrointestinal tract). If you think that you have an alcohol dependency problem, or if it is hard to stop drinking because you feel sick or different when you do not use alcohol, talk with your health care provider or another health professional about where to get help. Nutrition is an essential factor in therapy for alcohol abuse. Your health care provider or diet and nutrition specialist (dietitian) will work with you to design a plan that can help to restore nutrients to your body and prevent the risk of complications. What is my plan? Your dietitian may develop a specific eating plan that is based on your condition and any other problems that you have. An eating plan will commonly include:  A balanced diet. ? Grains: 6-8 oz (170-227 g) a day. Examples of 1 oz of whole grains include 1 cup of whole-wheat cereal,  cup of brown rice, or 1 slice of whole-wheat bread. ? Vegetables: 2-3 cups a day. Examples of 1 cup of vegetables include 2 medium carrots, 1 large tomato, or 2 stalks of celery. ? Fruits: 1-2 cups a day. Examples of 1 cup of fruit include 1 large banana, 1 small apple, 8 large strawberries, or 1 large orange. ? Meat and other protein: 5-6 oz (142-170 g) a day.  A cut of meat or fish that is the size of a deck of cards is about 3-4 oz.  Foods that provide 1 oz of protein include 1 egg,  cup of nuts or seeds, or 1 tablespoon (16 g) of peanut butter. ? Dairy: 2-3 cups a day. Examples of 1 cup of dairy include 8 oz (230 mL) of milk, 8 oz (230 g) of yogurt, or 1 oz (44 g) of natural cheese.  Vitamin and mineral supplements. What are tips for following this plan?  Eat frequent meals and snacks. Try to eat 5-6 small meals each day.  Take vitamin or mineral supplements as recommended by your dietitian.  If you are malnourished or if your dietitian recommends it: ? You may follow a high-protein, high-calorie diet. This may include:  2,000-3,000 calories  (kilocalories) a day.  70-100 g (grams) of protein a day. ? You may be directed to follow a diet that includes a complete nutritional supplement beverage. This can help to restore calories, protein, and vitamins to your body. Depending on your condition, you may be advised to consume this beverage instead of your meals or in addition to them.  Certain medicines may cause changes in your appetite, taste, and weight. Work with your health care provider and dietitian to make any changes to your medicines and eating plan.  If you are unable to take in enough food and calories by mouth, your health care provider may recommend a feeding tube. This tube delivers nutritional supplements directly to your stomach. Recommended foods  Eat foods that are high in molecules that prevent oxygen from reacting with your food (antioxidants). These foods include grapes, berries, nuts, green tea,  and dark green or orange vegetables. Eating these can help to prevent some of the stress that is placed on your liver by consuming alcohol.  Eat a variety of fresh fruits and vegetables each day. This will help you to get fiber and vitamins in your diet.  Drink plenty of water and other clear fluids, such as apple juice and broth. Try to drink at least 48-64 oz (1.5-2 L) of water a day.  Include foods fortified with vitamins and minerals in your diet. Commonly fortified foods include milk, orange juice, cereal, and bread.  Eat a variety of foods that are high in omega-3 and omega-6 fatty acids. These include fish, nuts and seeds, and soybeans. These foods may help your liver to recover and may also stabilize your mood.  If you are a vegetarian: ? Eat a variety of protein-rich foods. ? Pair whole grains with plant-based proteins at meals and snack time. For example, eat rice with beans, put peanut butter on whole-grain toast, or eat oatmeal with sunflower seeds. The items listed above may not be a complete list of foods  and beverages you can eat. Contact a dietitian for more information. Foods to avoid  Avoid foods and drinks that are high in fat and sugar. Sugary drinks, salty snacks, and candy contain empty calories. This means that they lack important nutrients such as protein, fiber, and vitamins.  Avoid alcohol. This is the best way to avoid malnutrition due to alcohol abuse. If you must drink, drink measured amounts. Measured drinking means limiting your intake to no more than 1 drink a day for nonpregnant women and 2 drinks a day for men. One drink equals 12 oz (355 mL) of beer, 5 oz (148 mL) of wine, or 1 oz (44 mL) of hard liquor.  Limit your intake of caffeine. Replace drinks like coffee and black tea with decaffeinated coffee and decaffeinated herbal tea. The items listed above may not be a complete list of foods and beverages you should avoid. Contact a dietitian for more information. Summary  Alcohol abuse can cause poor nutrition (malnutrition or malnourishment) and a lack of nutrients (nutrient deficiencies), which can lead to more health problems.  Common nutrient deficiencies include vitamin deficiencies (A, B, C, and D) and mineral deficiencies (calcium, iron, magnesium, and zinc).  Nutrition is an essential factor in therapy for alcohol abuse.  Your health care provider and dietitian can help you to develop a specific eating plan that includes a balanced diet plus vitamin and mineral supplements. This information is not intended to replace advice given to you by your health care provider. Make sure you discuss any questions you have with your health care provider. Document Released: 08/06/2005 Document Revised: 01/31/2019 Document Reviewed: 06/29/2017 Elsevier Patient Education  2020 [employer] Preventing Health Risks of Being Overweight Maintaining a healthy body weight is an important part of your overall health. Your healthy body weight depends on your age, gender, and height. Being  overweight puts you at risk for many health problems, including:  Heart disease.  Diabetes.  Problems sleeping.  Joint problems. You can make changes to your diet and lifestyle to prevent these risks. Consider working with a health care provider or a dietitian to make these changes. What nutrition changes can be made?   Eat only as much as your body needs. In most cases, this is about 2,000 calories a day, but the amount varies depending on your height, gender, and activity level. Ask your health care provider  how many calories you should have each day. Eating more than your body needs on a regular basis can cause you to become overweight or obese.  Eat slowly, and stop eating when you feel full.  Choose healthy foods, including: ? Fruits and vegetables. ? Lean meats. ? Low-fat dairy products. ? High-fiber foods, such as whole grains and beans. ? Healthy snacks like vegetable sticks, a piece of fruit, or a small amount of yogurt or cheese.  Avoid foods and drinks that are high in sugar, salt (sodium), saturated fat, or trans fat. This includes: ? Many desserts such as candy, cookies, and ice cream. ? Soda. ? Fried foods. ? Processed meats such as hot dogs or lunch meats. ? Prepackaged snack foods. What lifestyle changes can be made?   Exercise for at least 150 minutes a week to prevent weight gain, or as often as recommended by your health care provider. Do moderate-intensity exercise, such as brisk walking. ? Spread it out by exercising for 30 minutes 5 days a week, or in short 10-minute bursts several times a day.  Find other ways to stay active and burn calories, such as yard work or a hobby that involves physical activity.  Get at least 8 hours of sleep each night. When you are well-rested, you are more likely to be active and make healthy choices during the day. To sleep better: ? Try to go to bed and wake up at about the same time every day. ? Keep your bedroom dark,  quiet, and cool. ? Make sure that your bed is comfortable. ? Avoid stimulating activities, such as watching television or exercising, for at least one hour before bedtime. Why are these changes important? Eating healthy and being active helps you lose weight and prevent health problems caused by being overweight. Making these changes can also help you manage stress, feel better mentally, and connect with friends and family. What can happen if changes are not made? Being overweight can affect you for your entire life. You may develop joint or bone problems that make it painful or difficult for you to play sports or do activities you enjoy. Being overweight puts stress on your heart and lungs and can lead to medical problems like diabetes, heart disease, and sleeping problems. Where to find support You can get support for preventing health risks of being overweight from:  Your health care provider or a dietitian. They can provide guidance about healthy eating and healthy lifestyle choices.  Weight loss support groups, online or in-person. Where to find more information  MyPlate: FormerBoss[employer]no ? This an online tool that provides personalized recommendations about foods to eat each day.  The Centers for Disease Control and Prevention: http://sharp-hammond.biz/ ? This resource gives tips for managing weight and having an active lifestyle. Summary  To prevent unhealthy weight gain, it is important to maintain a healthy diet high in vegetables and whole grains, exercise regularly, and get at least 8 hours of sleep each night.  Making these changes helps prevent many long-term (chronic) health conditions that can shorten your life, such as diabetes, heart disease, and stroke. This information is not intended to replace advice given to you by your health care provider. Make sure you discuss any questions you have with your health care provider. Document Released: 09/08/2017 Document Revised:  10/15/2017 Document Reviewed: 09/08/2017 Elsevier Patient Education  2020 [place] Ask your health care provider which exercises are safe for you. Do exercises exactly as told by  your health care provider and adjust them as directed. It is normal to feel mild stretching, pulling, tightness, or discomfort as you do these exercises. Stop right away if you feel sudden pain or your pain gets worse. Do not begin these exercises until told by your health care provider. Stretching and range-of-motion exercises These exercises warm up your muscles and joints and improve the movement and flexibility of your elbow. These exercises also help to relieve pain, numbness, and tingling. Wrist flexion, assisted  1. Straighten your left / right elbow in front of you with your palm facing down toward the floor. ? If told by your health care provider, bend your left / right elbow to a 90-degree angle (right angle) at your side. 2. With your other hand, gently push over the back of your left / right hand so your fingers point toward the floor (flexion). Stop when you feel a gentle stretch on the back of your forearm. 3. Hold this position for _____5-15_____ seconds. Repeat ___3_______ times. Complete this exercise __3________ times a day. Wrist extension, assisted  1. Straighten your left / right elbow in front of you with your palm facing up toward the ceiling. ? If told by your health care provider, bend your left / right elbow to a 90-degree angle (right angle) at your side. 2. With your other hand, gently pull your left / right hand and fingers toward the floor (extension). Stop when you feel a gentle stretch on the palm side of your forearm. 3. Hold this position for ____5-15______ seconds. Repeat ______3____ times. Complete this exercise ____3______ times a day. Assisted forearm rotation, supination 1. Sit or stand with your left / right elbow bent to a 90-degree angle (right angle) at  your side. 2. Using your uninjured hand, turn (rotate) your left / right palm up toward the ceiling (supination) until you feel a gentle stretch along the inside of your forearm. 3. Hold this position for ______5-15____ seconds. Repeat ____3______ times. Complete this exercise ____3______ times a day. Assisted forearm rotation, pronation 1. Sit or stand with your left / right elbow bent to a 90-degree angle (right angle) at your side. 2. Using your uninjured hand, rotate your left / right palm down toward the floor (pronation) until you feel a gentle stretch along the outside of your forearm. 3. Hold this position for ___5-15_______ seconds. Repeat ___3_______ times. Complete this exercise _____3_____ times a day. Strengthening exercises These exercises build strength and endurance in your forearm and elbow. Endurance is the ability to use your muscles for a long time, even after they get tired. Radial deviation  1. Stand with a ____no______ weight or a hammer in your left / right hand. Or, sit while holding a rubber exercise band or tubing, with your left / right forearm supported on a table or countertop. ? If you are standing, position your forearm so that your thumb is facing forward. If you are sitting, position your forearm so that the thumb is facing the ceiling. This is the neutral position. 2. Raise your hand upward in front of you so your thumb moves toward the ceiling (radial deviation), or pull up on the rubber tubing. Keep your forearm and elbow still while you move your wrist only. 3. Hold this position for ____5-15______ seconds. 4. Slowly return to the starting position. Repeat _____3_____ times. Complete this exercise ______3____ times a day. Wrist extension, eccentric 1. Sit with your left / right forearm palm-down and supported on a table or  other surface. Let your left / right wrist extend over the edge of the surface. 2. Hold a ____no______ weight or a piece of exercise band  or tubing in your left / right hand. ? If using a rubber exercise band or tubing, hold the other end of the tubing with your other hand. 3. Use your uninjured hand to move your left / right hand up toward the ceiling. 4. Take your uninjured hand away and slowly return to the starting position using only your left / right hand. Lowering your arm under tension is called eccentric extension. Repeat __________ times. Complete this exercise __________ times a day. Wrist extension Do not do this exercise if it causes pain at the outside of your elbow. Only do this exercise once instructed by your health care provider. 1. Sit with your left / right forearm supported on a table or other surface and your palm turned down toward the floor. Let your left / right wrist extend over the edge of the surface. 2. Hold a __________ weight or a piece of rubber exercise band or tubing. ? If you are using a rubber exercise band or tubing, hold the band or tubing in place with your other hand to provide resistance. 3. Slowly bend your wrist so your hand moves up toward the ceiling (extension). Move only your wrist, keeping your forearm and elbow still. 4. Hold this position for __5-15________ seconds. 5. Slowly return to the starting position. Repeat ______3____ times. Complete this exercise __3________ times a day. Forearm rotation, supination To do this exercise, you will need a lightweight hammer or rubber mallet. 1. Sit with your left / right forearm supported on a table or other surface. Bend your elbow to a 90-degree angle (right angle). Position your forearm so that your palm is facing down toward the floor, with your hand resting over the edge of the table. 2. Hold a hammer in your left / right hand. ? To make this exercise easier, hold the hammer near the head of the hammer. ? To make this exercise harder, hold the hammer near the end of the handle. 3. Without moving your wrist or elbow, slowly rotate your  forearm so your palm faces up toward the ceiling (supination). 4. Hold this position for ___5-15_______ seconds. 5. Slowly return to the starting position. Repeat ___3_______ times. Complete this exercise ___3_______ times a day. Shoulder blade squeeze 1. Sit in a stable chair or stand with good posture. If you are sitting down, do not let your back touch the back of the chair. 2. Your arms should be at your sides with your elbows bent to a 90-degree angle (right angle). Position your forearms so that your thumbs are facing the ceiling (neutral position). 3. Without lifting your shoulders up, squeeze your shoulder blades tightly together. 4. Hold this position for ___5-15_______ seconds. 5. Slowly release and return to the starting position. Repeat ___3_______ times. Complete this exercise ____3______ times a day. This information is not intended to replace advice given to you by your health care provider. Make sure you discuss any questions you have with your health care provider. Document Released: 10/12/2005 Document Revised: 02/02/2019 Document Reviewed: 12/06/2018 Elsevier Patient Education  2020 [employer] Tennis Elbow  Tennis elbow (lateral epicondylitis) is inflammation of tendons in your outer forearm, near your elbow. Tendons are tissues that connect muscle to bone. When you have tennis elbow, inflammation affects the tendons that you use to bend your wrist and move your hand up.  Inflammation occurs in the lower part of the upper arm bone (humerus), where the tendons connect to the bone (lateral epicondyle). Tennis elbow often affects people who play tennis, but anyone may get the condition from repeatedly extending the wrist or turning the forearm. What are the causes? This condition is usually caused by repeatedly extending the wrist, turning the forearm, and using the hands. It can result from sports or work that requires repetitive forearm movements. In some cases, it may be  caused by a sudden injury. What increases the risk? You are more likely to develop tennis elbow if you play tennis or another racket sport. You also have a higher risk if you frequently use your hands for work. Besides people who play tennis, others at greater risk include:  Musicians.  Carpenters, painters, and plumbers.  Cooks.  Cashiers.  People who work in [employer].  Holiday representative workers.  Butchers.  People who use computers. What are the signs or symptoms? Symptoms of this condition include:  Pain and tenderness in the forearm and the outer part of the elbow. Pain may be felt only when using the arm, or it may be there all the time.  A burning feeling that starts in the elbow and spreads down the forearm.  A weak grip in the hand. How is this diagnosed? This condition may be diagnosed based on:  Your symptoms and medical history.  A physical exam.  X-rays.  MRI. How is this treated? Resting and icing your arm is often the first treatment. Your health care provider may also recommend:  Medicines to reduce pain and inflammation. These may be in the form of a pill, topical gels, or shots of a steroid medicine (cortisone).  An elbow strap to reduce stress on the area.  Physical therapy. This may include massage or exercises.  An elbow brace to restrict the movements that cause symptoms. If these treatments do not help relieve your symptoms, your health care provider may recommend surgery to remove damaged muscle and reattach healthy muscle to bone. Follow these instructions at home: Activity  Rest your elbow and wrist and avoid activities that cause symptoms, as told by your health care provider.  Do physical therapy exercises as instructed.  If you lift an object, lift it with your palm facing up. This reduces stress on your elbow. Lifestyle  If your tennis elbow is caused by sports, check your equipment and make sure that: ? You are using it correctly.  ? It is the best fit for you.  If your tennis elbow is caused by work or computer use, take frequent breaks to stretch your arm. Talk with your manager about ways to manage your condition at work. If you have a brace:  Wear the brace or strap as told by your health care provider. Remove it only as told by your health care provider.  Loosen the brace if your fingers tingle, become numb, or turn cold and blue.  Keep the brace clean.  If the brace is not waterproof, ask if you may remove it for bathing. If you must keep the brace on while bathing: ? Do not let it get wet. ? Cover it with a watertight covering when you take a bath or a shower. General instructions   If directed, put ice on the painful area: ? Put ice in a plastic bag. ? Place a towel between your skin and the bag. ? Leave the ice on for 20 minutes, 2-3 times a day.  Take over-the-counter and prescription medicines only as told by your health care provider.  Keep all follow-up visits as told by your health care provider. This is important. Contact a health care provider if:  You have pain that gets worse or does not get better with treatment.  You have numbness or weakness in your forearm, hand, or fingers. Summary  Tennis elbow (lateral epicondylitis) is inflammation of tendons in your outer forearm, near your elbow.  Common symptoms include pain and tenderness in your forearm and the outer part of your elbow.  This condition is usually caused by repeatedly extending your wrist, turning your forearm, and using your hands.  The first treatment is often resting and icing your arm to relieve symptoms. Further treatment may include taking medicine, getting physical therapy, wearing a brace or strap, or having surgery. This information is not intended to replace advice given to you by your health care provider. Make sure you discuss any questions you have with your health care provider. Document Released: 10/12/2005  Document Revised: 07/08/2018 Document Reviewed: 07/27/2017 Elsevier Patient Education  2020 Else[employer] Inc. Pityriasis Rosea Pityriasis rosea is a rash that usually appears on the chest, abdomen, and back. It may also appear on the upper arms and upper legs. It usually begins as a single patch, and then more patches start to develop. The rash may cause mild itching, but it normally does not cause other problems. It usually goes away without treatment. However, it may take weeks or months for the rash to go away completely. What are the causes? The cause of this condition is not known. The condition does not spread from person to person (is not contagious). What increases the risk? This condition is more likely to develop in:  Persons aged 10-35 years.  Pregnant women. It is more common in the spring and fall seasons. What are the signs or symptoms? The main symptom of this condition is a rash.  The rash usually begins with a single oval patch that is larger than the ones that follow. This is called a herald patch. It generally appears a week or more before the rest of the rash appears.  When more patches start to develop, they spread quickly on the chest, abdomen, back, arms, and legs. These patches are smaller than the first one.  The patches that make up the rash are usually oval-shaped and pink or red in color. They are usually flat but may sometimes be raised so that they can be felt with a finger. They may also be finely crinkled and have a scaly ring around the edge. Some people may have mild itching and nonspecific symptoms, such as:  Nausea.  Loss of appetite.  Difficulty concentrating.  Headache.  Irritability.  Sore throat.  Mild fever. How is this diagnosed? This condition may be diagnosed based on:  Your medical history and a physical exam.  Tests to rule out other causes. This may include blood tests or a test in which a small sample of skin is removed from the  rash (biopsy) and checked in a lab. How is this treated?     Treatment is not usually needed for this condition. The rash will often go away on its own in 4-8 weeks. In some cases, a health care provider may recommend or prescribe medicine to reduce itching. Follow these instructions at home:  Take or apply over-the-counter and prescription medicines only as told by your health care provider.  Avoid scratching the affected areas of  skin.  Do not take hot baths or use a sauna. Use only warm water when bathing or showering. Heat can increase itching. Adding cornstarch to your bath may help to relieve the itching.  Avoid exposure to the sun and other sources of UV light, such as tanning beds, as told by your health care provider. UV light may help the rash go away but may cause unwanted changes in skin color.  Keep all follow-up visits as told by your health care provider. This is important. Contact a health care provider if:  Your rash does not go away in 8 weeks.  Your rash gets much worse.  You have a fever.  You have swelling or pain in the rash area.  You have fluid, blood, or pus coming from the rash area. Summary  Pityriasis rosea is a rash that usually appears on the trunk of the body. It can also appear on the upper arms and upper legs.  The rash usually begins with a single oval patch (herald patch) that appears a week or more before the rest of the rash appears. The herald patch is larger than the ones that follow.  The rash may cause mild itching, but it usually does not cause other problems. It usually goes away without treatment in 4-8 weeks.  In some cases, a health care provider may recommend or prescribe medicine to reduce itching. This information is not intended to replace advice given to you by your health care provider. Make sure you discuss any questions you have with your health care provider. Document Released: 11/18/2001 Document Revised: 10/11/2017  Document Reviewed: 10/11/2017 Elsevier Patient Education  2020 [employer].

## 2019-10-12 ENCOUNTER — Encounter: Payer: Self-pay | Admitting: Physician Assistant

## 2019-11-23 ENCOUNTER — Encounter: Payer: Self-pay | Admitting: Internal Medicine

## 2019-11-23 ENCOUNTER — Other Ambulatory Visit: Payer: Self-pay | Admitting: Internal Medicine

## 2019-11-23 DIAGNOSIS — N529 Male erectile dysfunction, unspecified: Secondary | ICD-10-CM

## 2019-11-23 NOTE — Telephone Encounter (Signed)
Paper chart review conducted with RN Dobbins via telephone at 1530 today.  Dr Dorris Fetch last filled sildenafil 20mg  1-3 tabs po prn #30 RF5 Jun 2020.  Last office visit with me Sep 27 2019.  Electronic Rx sent to his pharmacy of choice sildenafil 20mg  take 1-3 tabs 30 min to 4 hours prior to sexual intercourse once a day prn #30 RF5.

## 2019-12-26 NOTE — Progress Notes (Signed)
Patient comes in today for 3 month follow up labs requested by Albina Billet, PA-C.

## 2019-12-27 ENCOUNTER — Other Ambulatory Visit: Payer: Self-pay

## 2019-12-27 DIAGNOSIS — R7989 Other specified abnormal findings of blood chemistry: Secondary | ICD-10-CM

## 2019-12-28 LAB — HEPATIC FUNCTION PANEL
ALT: 26 IU/L (ref 0–44)
AST: 34 IU/L (ref 0–40)
Albumin: 4.6 g/dL (ref 4.0–5.0)
Alkaline Phosphatase: 55 IU/L (ref 39–117)
Bilirubin Total: 0.4 mg/dL (ref 0.0–1.2)
Bilirubin, Direct: 0.15 mg/dL (ref 0.00–0.40)
Total Protein: 6.8 g/dL (ref 6.0–8.5)

## 2019-12-28 LAB — CBC WITH DIFFERENTIAL/PLATELET
Basophils Absolute: 0.1 10*3/uL (ref 0.0–0.2)
Basos: 1 %
EOS (ABSOLUTE): 0.4 10*3/uL (ref 0.0–0.4)
Eos: 4 %
Hematocrit: 42.9 % (ref 37.5–51.0)
Hemoglobin: 14.9 g/dL (ref 13.0–17.7)
Immature Grans (Abs): 0 10*3/uL (ref 0.0–0.1)
Immature Granulocytes: 0 %
Lymphocytes Absolute: 2.3 10*3/uL (ref 0.7–3.1)
Lymphs: 23 %
MCH: 34.2 pg — ABNORMAL HIGH (ref 26.6–33.0)
MCHC: 34.7 g/dL (ref 31.5–35.7)
MCV: 98 fL — ABNORMAL HIGH (ref 79–97)
Monocytes Absolute: 0.6 10*3/uL (ref 0.1–0.9)
Monocytes: 6 %
Neutrophils Absolute: 6.9 10*3/uL (ref 1.4–7.0)
Neutrophils: 66 %
Platelets: 237 10*3/uL (ref 150–450)
RBC: 4.36 x10E6/uL (ref 4.14–5.80)
RDW: 12.4 % (ref 11.6–15.4)
WBC: 10.3 10*3/uL (ref 3.4–10.8)

## 2019-12-29 DIAGNOSIS — U071 COVID-19: Secondary | ICD-10-CM | POA: Diagnosis not present

## 2019-12-29 DIAGNOSIS — Z20828 Contact with and (suspected) exposure to other viral communicable diseases: Secondary | ICD-10-CM | POA: Diagnosis not present

## 2020-01-03 ENCOUNTER — Ambulatory Visit: Payer: Self-pay

## 2020-01-09 ENCOUNTER — Telehealth: Payer: Self-pay

## 2020-01-09 NOTE — Telephone Encounter (Signed)
Samuel Stevenson called to cancel appt scheduled for 01/11/20 with Durward Parcel, PA-C to review labs previously ordered by Albina Billet, NP-C.  Asked if he saw Tina's message dated 12/28/19 in his mychart & he said he hadn't seen it.  Informed him he can see the actual lab results in mychart & there's a message from [place] to him.    Reviewed the lab results with him & the questions Inetta Fermo sent him. 1.  Rash & Elbow Pain - resolved 2.  Alcohol consumption -  decreased 3.  Tylenol/Ibuprofen - decreased  4.  States he's trying to stay more active around the house & has been walking some.  He was pleased to know that changes he made in his life showed in his lab results.  States he will call back if he has any questions.  Chose not to reschedule appt at this time.  AMD

## 2020-01-09 NOTE — Telephone Encounter (Signed)
Noted patient cancelled follow up appt with PA Katrinka Blazing but rash and elbow pain have resolved and he continues to make healthy TLC.  RN Paschal Dopp reviewed lab results and note/instructions with patient as he had not reviewed mychart message.

## 2020-01-11 ENCOUNTER — Ambulatory Visit: Payer: Self-pay

## 2020-01-15 ENCOUNTER — Encounter: Payer: Self-pay | Admitting: Registered Nurse

## 2020-01-15 ENCOUNTER — Telehealth: Payer: Self-pay | Admitting: Registered Nurse

## 2020-01-15 NOTE — Telephone Encounter (Signed)
Noted positive SARS Covid 19 test 12/29/2019 [facility]  Please follow up with patient to ensure covid symptoms resolved or if having any long covid symptoms.  Follow up labs 12/27/2019 for elevated LFTs improved returned to normal with TLC.  RN Paschal Dopp reviewed results with patient for 12/27/2019.

## 2020-01-17 NOTE — Telephone Encounter (Signed)
Tried to call Mick & went straight to voice mail.  Left call back message.  AMD

## 2020-01-22 ENCOUNTER — Telehealth: Payer: Self-pay

## 2020-01-22 NOTE — Telephone Encounter (Signed)
Called Mick to see if covid symptoms resolved.  States he never had any covid symptoms.  York Spaniel his daughter was exposed to someone at work who tested positive.  She wasn't having symptoms, but was tested because of the exposure & her covid test was positive.  He went to get a covid test because he was around his daughter & his test was positive.  States neither of them developed symptoms.  Discussed locations for covid vaccine.  He does want the vaccine & plans to get it.  AMD

## 2020-01-23 NOTE — Telephone Encounter (Signed)
Noted no covid symptoms only positive test.

## 2020-02-09 NOTE — Telephone Encounter (Signed)
Next patient labs to be with annual physical as LFTs WNL and CBC almost normal/improved.  Continue lower alcohol intake/exercise and weight loss.

## 2020-05-14 DIAGNOSIS — D2262 Melanocytic nevi of left upper limb, including shoulder: Secondary | ICD-10-CM | POA: Diagnosis not present

## 2020-05-14 DIAGNOSIS — L821 Other seborrheic keratosis: Secondary | ICD-10-CM | POA: Diagnosis not present

## 2020-05-14 DIAGNOSIS — D2261 Melanocytic nevi of right upper limb, including shoulder: Secondary | ICD-10-CM | POA: Diagnosis not present

## 2020-05-14 DIAGNOSIS — D485 Neoplasm of uncertain behavior of skin: Secondary | ICD-10-CM | POA: Diagnosis not present

## 2020-05-14 DIAGNOSIS — D2272 Melanocytic nevi of left lower limb, including hip: Secondary | ICD-10-CM | POA: Diagnosis not present

## 2020-05-14 DIAGNOSIS — D2271 Melanocytic nevi of right lower limb, including hip: Secondary | ICD-10-CM | POA: Diagnosis not present

## 2020-05-14 DIAGNOSIS — D225 Melanocytic nevi of trunk: Secondary | ICD-10-CM | POA: Diagnosis not present

## 2020-07-13 DIAGNOSIS — R1031 Right lower quadrant pain: Secondary | ICD-10-CM | POA: Diagnosis not present

## 2020-07-13 DIAGNOSIS — N50811 Right testicular pain: Secondary | ICD-10-CM | POA: Diagnosis not present

## 2020-07-15 ENCOUNTER — Other Ambulatory Visit: Payer: Self-pay

## 2020-07-15 ENCOUNTER — Other Ambulatory Visit: Payer: Self-pay | Admitting: Family Medicine

## 2020-07-15 ENCOUNTER — Ambulatory Visit
Admission: RE | Admit: 2020-07-15 | Discharge: 2020-07-15 | Disposition: A | Discharge status: Home / Self Care | Payer: 59 | Source: Ambulatory Visit | Attending: Family Medicine | Admitting: Family Medicine

## 2020-07-15 DIAGNOSIS — N50811 Right testicular pain: Secondary | ICD-10-CM | POA: Diagnosis not present

## 2020-07-15 DIAGNOSIS — R1031 Right lower quadrant pain: Secondary | ICD-10-CM | POA: Diagnosis not present

## 2020-07-15 DIAGNOSIS — I861 Scrotal varices: Secondary | ICD-10-CM | POA: Diagnosis not present

## 2020-08-01 ENCOUNTER — Ambulatory Visit: Payer: Self-pay

## 2020-08-01 ENCOUNTER — Other Ambulatory Visit: Payer: Self-pay

## 2020-08-01 DIAGNOSIS — Z0000 Encounter for general adult medical examination without abnormal findings: Secondary | ICD-10-CM

## 2020-08-01 LAB — POCT URINALYSIS DIPSTICK
Bilirubin, UA: NEGATIVE
Blood, UA: NEGATIVE
Glucose, UA: NEGATIVE
Ketones, UA: POSITIVE
Leukocytes, UA: NEGATIVE
Nitrite, UA: NEGATIVE
Protein, UA: NEGATIVE
Spec Grav, UA: 1.02 (ref 1.010–1.025)
Urobilinogen, UA: 0.2 E.U./dL
pH, UA: 6 (ref 5.0–8.0)

## 2020-08-01 NOTE — Progress Notes (Signed)
Scheduled to complete physical 08/08/20 with Bridget Hartshorn, PA-C.  EKG reviewed by Durward Parcel, PA-C  AMD

## 2020-08-02 LAB — CMP12+LP+TP+TSH+6AC+PSA+CBC…
ALT: 18 IU/L (ref 0–44)
AST: 22 IU/L (ref 0–40)
Albumin/Globulin Ratio: 2 (ref 1.2–2.2)
Albumin: 4.8 g/dL (ref 3.8–4.9)
Alkaline Phosphatase: 52 IU/L (ref 44–121)
BUN/Creatinine Ratio: 7 — ABNORMAL LOW (ref 9–20)
BUN: 6 mg/dL (ref 6–24)
Basophils Absolute: 0.1 10*3/uL (ref 0.0–0.2)
Basos: 1 %
Bilirubin Total: 0.6 mg/dL (ref 0.0–1.2)
Calcium: 9.4 mg/dL (ref 8.7–10.2)
Chloride: 100 mmol/L (ref 96–106)
Chol/HDL Ratio: 2.7 ratio (ref 0.0–5.0)
Cholesterol, Total: 202 mg/dL — ABNORMAL HIGH (ref 100–199)
Creatinine, Ser: 0.82 mg/dL (ref 0.76–1.27)
EOS (ABSOLUTE): 0.3 10*3/uL (ref 0.0–0.4)
Eos: 3 %
Estimated CHD Risk: <0.5 times avg. (ref 0.0–1.0)
Free Thyroxine Index: 1.5 (ref 1.2–4.9)
GFR calc Af Amer: 118 mL/min/{1.73_m2} (ref >59)
GFR calc non Af Amer: 102 mL/min/{1.73_m2} (ref >59)
GGT: 34 IU/L (ref 0–65)
Globulin, Total: 2.4 g/dL (ref 1.5–4.5)
Glucose: 99 mg/dL (ref 65–99)
HDL: 76 mg/dL (ref >39)
Hematocrit: 43 % (ref 37.5–51.0)
Hemoglobin: 14.8 g/dL (ref 13.0–17.7)
Immature Grans (Abs): 0 10*3/uL (ref 0.0–0.1)
Immature Granulocytes: 0 %
Iron: 99 ug/dL (ref 38–169)
LDH: 257 IU/L — ABNORMAL HIGH (ref 121–224)
LDL Chol Calc (NIH): 117 mg/dL — ABNORMAL HIGH (ref 0–99)
Lymphocytes Absolute: 2.6 10*3/uL (ref 0.7–3.1)
Lymphs: 25 %
MCH: 34.3 pg — ABNORMAL HIGH (ref 26.6–33.0)
MCHC: 34.4 g/dL (ref 31.5–35.7)
MCV: 100 fL — ABNORMAL HIGH (ref 79–97)
Monocytes Absolute: 0.5 10*3/uL (ref 0.1–0.9)
Monocytes: 5 %
Neutrophils Absolute: 7 10*3/uL (ref 1.4–7.0)
Neutrophils: 66 %
Phosphorus: 3.7 mg/dL (ref 2.8–4.1)
Platelets: 248 10*3/uL (ref 150–450)
Potassium: 4.9 mmol/L (ref 3.5–5.2)
Prostate Specific Ag, Serum: 0.5 ng/mL (ref 0.0–4.0)
RBC: 4.31 x10E6/uL (ref 4.14–5.80)
RDW: 12.7 % (ref 11.6–15.4)
Sodium: 138 mmol/L (ref 134–144)
T3 Uptake Ratio: 28 % (ref 24–39)
T4, Total: 5.5 ug/dL (ref 4.5–12.0)
TSH: 1.76 u[IU]/mL (ref 0.450–4.500)
Total Protein: 7.2 g/dL (ref 6.0–8.5)
Triglycerides: 46 mg/dL (ref 0–149)
Uric Acid: 5.1 mg/dL (ref 3.8–8.4)
VLDL Cholesterol Cal: 9 mg/dL (ref 5–40)
WBC: 10.5 10*3/uL (ref 3.4–10.8)

## 2020-08-08 ENCOUNTER — Ambulatory Visit: Payer: Self-pay | Admitting: Emergency Medicine

## 2020-08-08 ENCOUNTER — Encounter: Payer: Self-pay | Admitting: Emergency Medicine

## 2020-08-08 ENCOUNTER — Other Ambulatory Visit: Payer: Self-pay

## 2020-08-08 VITALS — BP 150/90 | HR 78 | Temp 98.0°F | Resp 14 | Ht 71.0 in | Wt 199.0 lb

## 2020-08-08 DIAGNOSIS — N529 Male erectile dysfunction, unspecified: Secondary | ICD-10-CM

## 2020-08-08 DIAGNOSIS — Z0000 Encounter for general adult medical examination without abnormal findings: Secondary | ICD-10-CM

## 2020-08-08 MED ORDER — SILDENAFIL CITRATE 20 MG PO TABS: ORAL_TABLET | ORAL | 5 refills | Status: DC

## 2020-08-08 NOTE — Progress Notes (Signed)
Pt presents today to complete physical with Bridget Hartshorn PA-C, pt is also requesting a new rx for previous rx he has had. Pt has also mentioned he has had issues with mucous in the morning, I suggested he starts and OTC allergy medication to help dry it up. CL,RMA

## 2020-08-08 NOTE — Progress Notes (Signed)
I have reviewed the triage vital signs and the nursing notes.   HISTORY  Chief Complaint Annual Exam  HPI Samuel Stevenson is a 49 y.o. male is here for his annual physical.  Patient also wants to talk about options to help him quit smoking.  He has been on Chantix in the past and states that he did have nightmares but was not suicidal.  He recalls that the medication was extremely expensive.  He was given prices of all 3 options based on information found on Good[employer].  He will consider the 3 and let us know.        Past Medical History:  Diagnosis Date  . Allergy   . Asthma   . ED (erectile dysfunction)   . Elevated glucose   . Epicondylitis, lateral, right 2012  . Hyperlipidemia   . Overweight   . Plantar fasciitis     There are no problems to display for this patient.   Past Surgical History:  Procedure Laterality Date  . BACK SURGERY  2018   L4-L5    Prior to Admission medications   Medication Sig Start Date End Date Taking? Authorizing Provider  naproxen sodium (ALEVE) 220 MG tablet Take 440 mg by mouth daily as needed.   Yes [provider]  sildenafil (REVATIO) 20 MG tablet TAKE 1-3 TABLETS BY MOUTH 30 MINUTES TO 4 HOURS PRIOR TO SEXUAL INTERCOURSE ONCE DAILY AS NEEDED 08/08/20  Yes Tommi Rumps, PA-C    Allergies Patient has no known allergies.  History reviewed. No pertinent family history.  Social History Social History   Tobacco Use  . Smoking status: Current Every Day Smoker    Packs/day: 1.00    Types: Cigarettes  . Smokeless tobacco: Never Used  Vaping Use  . Vaping Use: Never used  Substance Use Topics  . Alcohol use: Yes  . Drug use: Not on file    Review of Systems Constitutional: No fever/chills Eyes: No visual changes. ENT: No sore throat. Cardiovascular: Denies chest pain. Respiratory: Denies shortness of breath. Gastrointestinal: No abdominal pain.  No nausea, no vomiting.  Musculoskeletal: Negative for muscle  skeletal complaints. Skin: Negative for rash. Neurological: Negative for headaches, focal weakness or numbness. ____________________________________________   PHYSICAL EXAM:  Constitutional: Alert and oriented. Well appearing and in no acute distress. Eyes: Conjunctivae are normal. PERRL. EOMI. Head: Atraumatic. Nose: No congestion/rhinnorhea. Neck: No stridor.  Nontender cervical spine to palpation. Hematological/Lymphatic/Immunilogical: No cervical lymphadenopathy. Cardiovascular: Normal rate, regular rhythm. Grossly normal heart sounds.  Good peripheral circulation. Respiratory: Normal respiratory effort.  No retractions. Lungs CTAB. Gastrointestinal: Soft and nontender. No distention.  Bowel sounds normoactive x4 quadrants. Musculoskeletal: Nontender joints.  Moves upper and lower extremities with any difficulty.  Good muscle strength bilaterally.  Normal gait was noted. Neurologic:  Normal speech and language. No gross focal neurologic deficits are appreciated. No gait instability. Skin:  Skin is warm, dry and intact. No rash noted. Psychiatric: Mood and affect are normal. Speech and behavior are normal.  ____________________________________________   LABS (all labs ordered are listed, but only abnormal results are displayed)  Labs were discussed with patient. ____________________________________________  EKG  Sinus rhythm with a ventricular rate of 78.     FINAL CLINICAL IMPRESSION(S)  Normal annual physical. History of cigarette smoking with desire to discontinue smoking. Medication refilled.    ED Discharge Orders         Ordered    sildenafil (REVATIO) 20 MG tablet  08/08/20 0909          Note:  This document was prepared using Dragon voice recognition software and may include unintentional dictation errors.

## 2021-01-13 DIAGNOSIS — M461 Sacroiliitis, not elsewhere classified: Secondary | ICD-10-CM | POA: Diagnosis not present

## 2021-01-13 DIAGNOSIS — M9903 Segmental and somatic dysfunction of lumbar region: Secondary | ICD-10-CM | POA: Diagnosis not present

## 2021-01-13 DIAGNOSIS — M9904 Segmental and somatic dysfunction of sacral region: Secondary | ICD-10-CM | POA: Diagnosis not present

## 2021-08-01 ENCOUNTER — Ambulatory Visit: Payer: Self-pay

## 2021-08-01 ENCOUNTER — Other Ambulatory Visit: Payer: Self-pay

## 2021-08-01 DIAGNOSIS — Z1152 Encounter for screening for COVID-19: Secondary | ICD-10-CM

## 2021-08-01 DIAGNOSIS — Z0000 Encounter for general adult medical examination without abnormal findings: Secondary | ICD-10-CM

## 2021-08-01 DIAGNOSIS — Z23 Encounter for immunization: Secondary | ICD-10-CM

## 2021-08-01 LAB — POCT URINALYSIS DIPSTICK
Bilirubin, UA: NEGATIVE
Blood, UA: NEGATIVE
Glucose, UA: NEGATIVE
Ketones, UA: NEGATIVE
Leukocytes, UA: NEGATIVE
Nitrite, UA: NEGATIVE
Protein, UA: NEGATIVE
Spec Grav, UA: 1.01 (ref 1.010–1.025)
Urobilinogen, UA: 0.2 E.U./dL
pH, UA: 6 (ref 5.0–8.0)

## 2021-08-01 NOTE — Addendum Note (Signed)
Addended by: Christianne Dolin F on: 08/01/2021 09:05 AM   Modules accepted: Orders

## 2021-08-01 NOTE — Progress Notes (Addendum)
Annual scheduled 08/04/21  Reviewed CDC recommendations for importance of HIV/ Hep C screening once in lifetime. Patient has declined HIV / Hep C screenings today and will let us know if they should change their mind in the future.   Reviewed CDC recommendations for importance of the COVID-19 Vaccine. Pt has declined the vaccine today and for the future.

## 2021-08-02 LAB — CMP12+LP+TP+TSH+6AC+PSA+CBC…
ALT: 26 IU/L (ref 0–44)
AST: 25 IU/L (ref 0–40)
Albumin/Globulin Ratio: 2.9 — ABNORMAL HIGH (ref 1.2–2.2)
Albumin: 5 g/dL — ABNORMAL HIGH (ref 3.8–4.9)
Alkaline Phosphatase: 52 IU/L (ref 44–121)
BUN/Creatinine Ratio: 10 (ref 9–20)
BUN: 6 mg/dL (ref 6–24)
Basophils Absolute: 0 10*3/uL (ref 0.0–0.2)
Basos: 1 %
Bilirubin Total: 0.6 mg/dL (ref 0.0–1.2)
Calcium: 9.4 mg/dL (ref 8.7–10.2)
Chloride: 99 mmol/L (ref 96–106)
Chol/HDL Ratio: 2.4 ratio (ref 0.0–5.0)
Cholesterol, Total: 188 mg/dL (ref 100–199)
Creatinine, Ser: 0.59 mg/dL — ABNORMAL LOW (ref 0.76–1.27)
EOS (ABSOLUTE): 0.3 10*3/uL (ref 0.0–0.4)
Eos: 4 %
Estimated CHD Risk: <0.5 times avg. (ref 0.0–1.0)
Free Thyroxine Index: 1.8 (ref 1.2–4.9)
GGT: 37 IU/L (ref 0–65)
Globulin, Total: 1.7 g/dL (ref 1.5–4.5)
Glucose: 93 mg/dL (ref 70–99)
HDL: 77 mg/dL (ref >39)
Hematocrit: 42.7 % (ref 37.5–51.0)
Hemoglobin: 15.1 g/dL (ref 13.0–17.7)
Immature Grans (Abs): 0 10*3/uL (ref 0.0–0.1)
Immature Granulocytes: 0 %
Iron: 90 ug/dL (ref 38–169)
LDH: 231 IU/L — ABNORMAL HIGH (ref 121–224)
LDL Chol Calc (NIH): 101 mg/dL — ABNORMAL HIGH (ref 0–99)
Lymphocytes Absolute: 2.1 10*3/uL (ref 0.7–3.1)
Lymphs: 26 %
MCH: 34.5 pg — ABNORMAL HIGH (ref 26.6–33.0)
MCHC: 35.4 g/dL (ref 31.5–35.7)
MCV: 98 fL — ABNORMAL HIGH (ref 79–97)
Monocytes Absolute: 0.5 10*3/uL (ref 0.1–0.9)
Monocytes: 6 %
Neutrophils Absolute: 5.1 10*3/uL (ref 1.4–7.0)
Neutrophils: 63 %
Phosphorus: 4 mg/dL (ref 2.8–4.1)
Platelets: 225 10*3/uL (ref 150–450)
Potassium: 4.7 mmol/L (ref 3.5–5.2)
Prostate Specific Ag, Serum: 0.4 ng/mL (ref 0.0–4.0)
RBC: 4.38 x10E6/uL (ref 4.14–5.80)
RDW: 12.7 % (ref 11.6–15.4)
Sodium: 137 mmol/L (ref 134–144)
T3 Uptake Ratio: 31 % (ref 24–39)
T4, Total: 5.7 ug/dL (ref 4.5–12.0)
TSH: 1.54 u[IU]/mL (ref 0.450–4.500)
Total Protein: 6.7 g/dL (ref 6.0–8.5)
Triglycerides: 53 mg/dL (ref 0–149)
Uric Acid: 4.5 mg/dL (ref 3.8–8.4)
VLDL Cholesterol Cal: 10 mg/dL (ref 5–40)
WBC: 8.1 10*3/uL (ref 3.4–10.8)
eGFR: 117 mL/min/{1.73_m2} (ref >59)

## 2021-08-04 ENCOUNTER — Ambulatory Visit: Payer: Self-pay | Admitting: Physician Assistant

## 2021-08-04 ENCOUNTER — Other Ambulatory Visit: Payer: Self-pay

## 2021-08-04 ENCOUNTER — Encounter: Payer: Self-pay | Admitting: Physician Assistant

## 2021-08-04 VITALS — BP 150/76 | HR 90 | Temp 99.3°F | Resp 16 | Ht 71.0 in | Wt 202.0 lb

## 2021-08-04 DIAGNOSIS — Z1211 Encounter for screening for malignant neoplasm of colon: Secondary | ICD-10-CM

## 2021-08-04 DIAGNOSIS — Z0000 Encounter for general adult medical examination without abnormal findings: Secondary | ICD-10-CM

## 2021-08-04 NOTE — Progress Notes (Signed)
   Subjective: Annual physical exam    Patient ID: Samuel Stevenson, male    DOB: 1971/04/27, 50 y.o.   MRN: [id number]  HPI Patient presents annual physical exam.  Patient requests consult for colonoscopy.   Review of Systems Diarrhea    Objective:   Physical Exam No acute distress.  Temperature 99.3, pulse 90, respirations 16, BP is 150/76, and patient 99% O2 sat on room air.  Patient weighs 202 pounds BMI is 28.17. HEENT is unremarkable.  Neck is supple without adenopathy or bruits.  Lungs are clear to auscultation.  Heart regular rate and rhythm.  No acute findings on chest x-ray. Abdomen is negative HSM, normoactive bowel sounds, soft, and nontender to palpation. No obvious deformity to the upper or lower extremities.  Patient has full and equal range of motion of the upper and lower extremities. No obvious cervical or lumbar spine deformity.  Patient has full and equal range of motion of the cervical lumbar spine. Cranial nerves II through XII grossly intact.  DTR 2+ without clonus.       Assessment & Plan: Well exam.   Discussed lab results with patient.  Advised to follow-up as necessary.

## 2021-08-19 ENCOUNTER — Other Ambulatory Visit: Payer: Self-pay

## 2021-08-19 DIAGNOSIS — N529 Male erectile dysfunction, unspecified: Secondary | ICD-10-CM

## 2021-08-19 MED ORDER — SILDENAFIL CITRATE 20 MG PO TABS: ORAL_TABLET | ORAL | 5 refills | Status: DC

## 2021-08-21 IMAGING — US US SCROTUM W/ DOPPLER COMPLETE
1 series · 14 of 25 positions shown · non-contrast
Comparison: None.

CLINICAL DATA: Four days of right testicular pain as well as right
inguinal/groin pain

EXAM:
SCROTAL ULTRASOUND
DOPPLER ULTRASOUND OF THE TESTICLES
TECHNIQUE: Complete ultrasound examination of the testicles, epididymis, and
other scrotal structures was performed. Color and spectral Doppler
ultrasound were also utilized to evaluate blood flow to the
testicles.

[Series 1: us scrotum w/doppler · 14 of 64 slices shown]
[im 1/64]
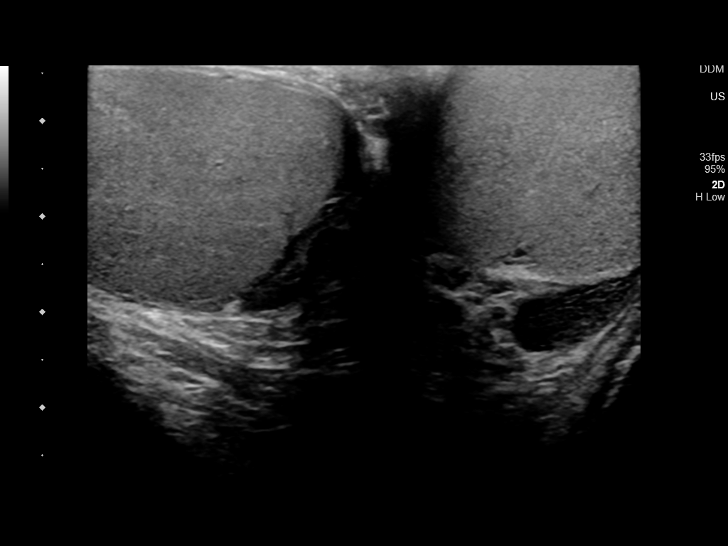
[im 6/64]
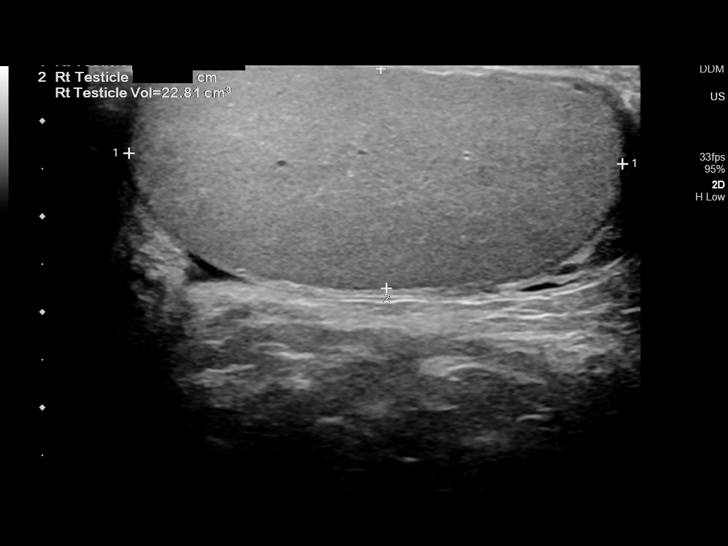
[im 11/64]
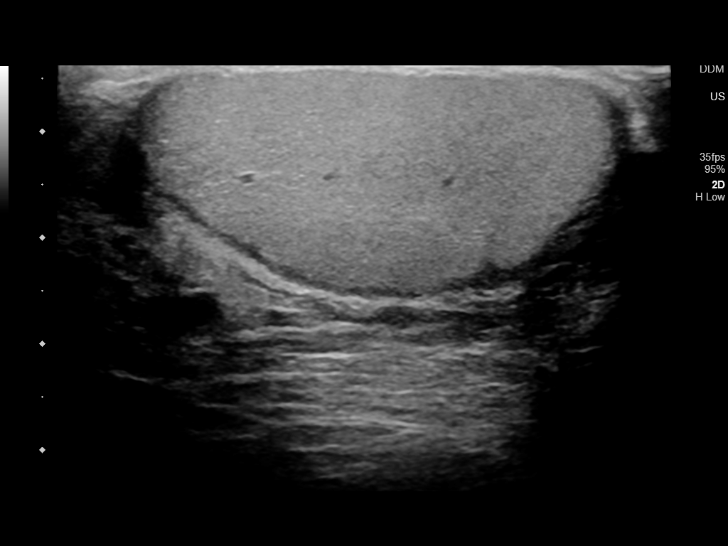
[im 16/64]
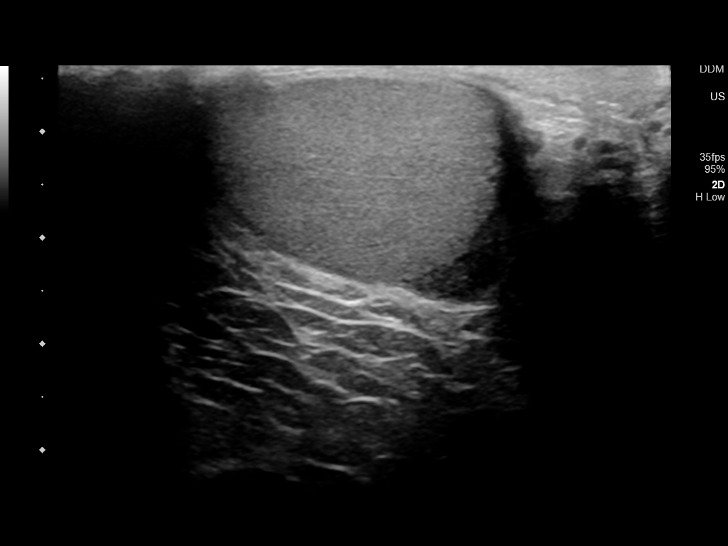
[im 22/64]
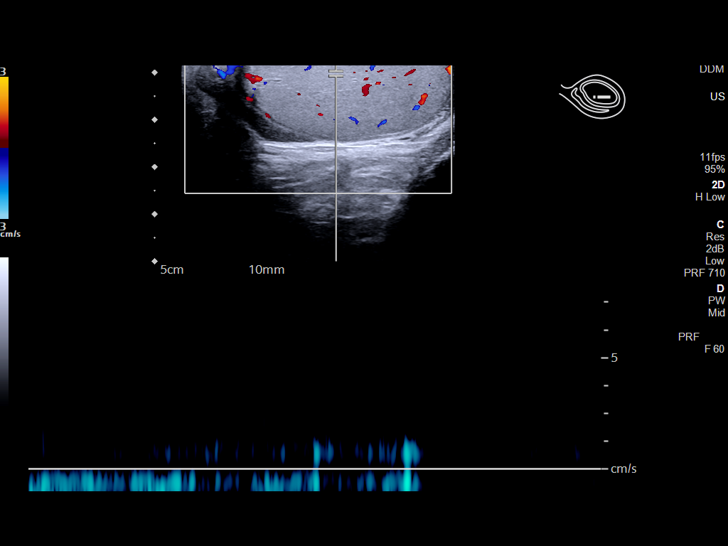
[im 24/64]
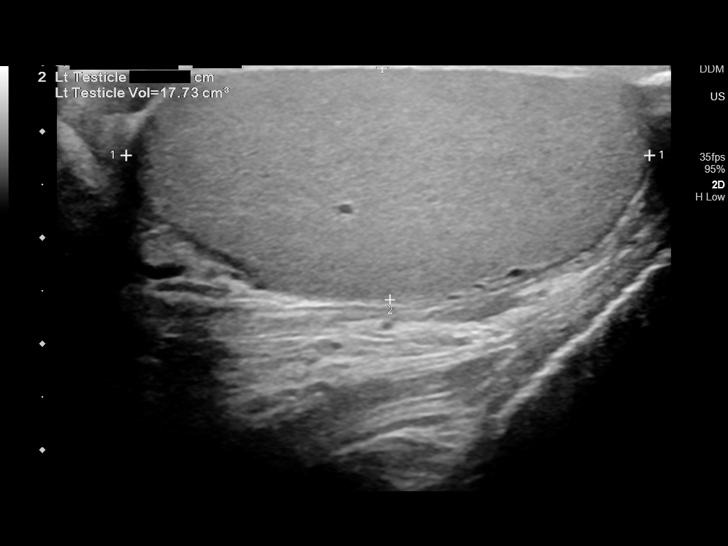
[im 29/64]
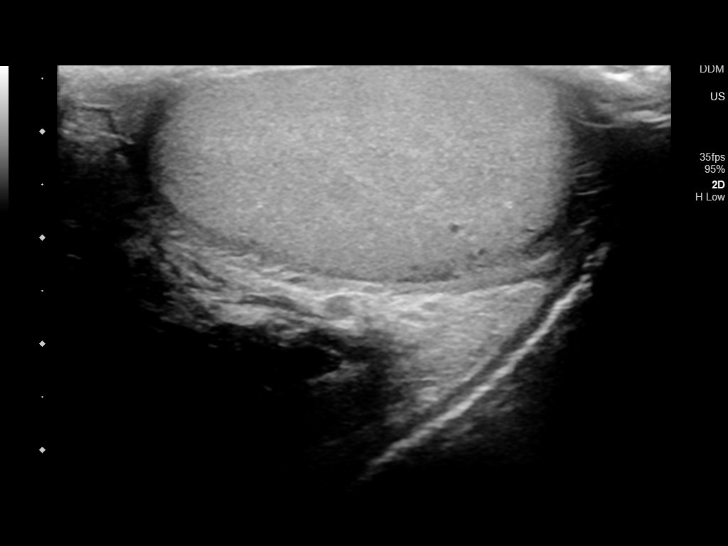
[im 35/64]
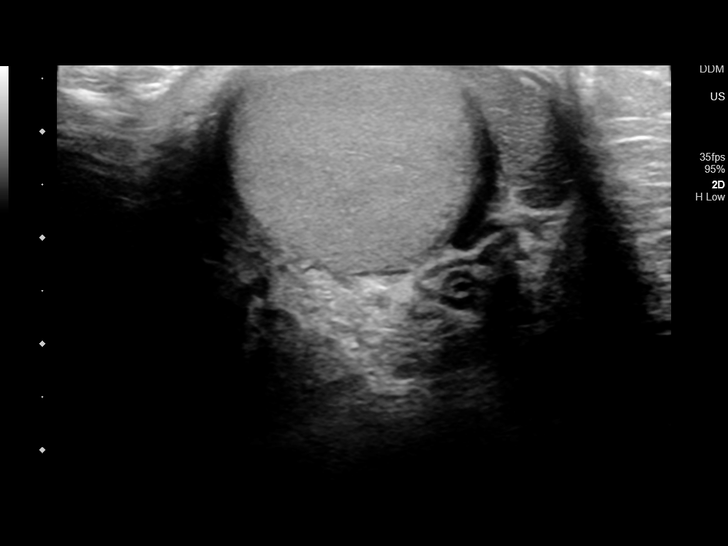
[im 40/64]
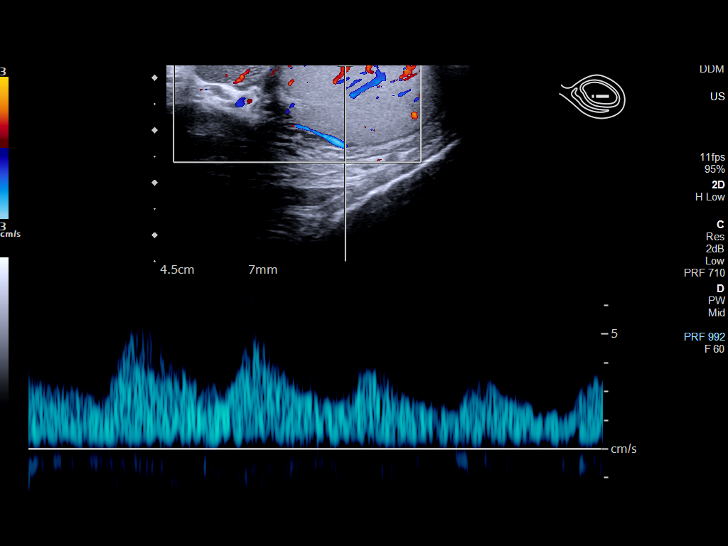
[im 43/64]
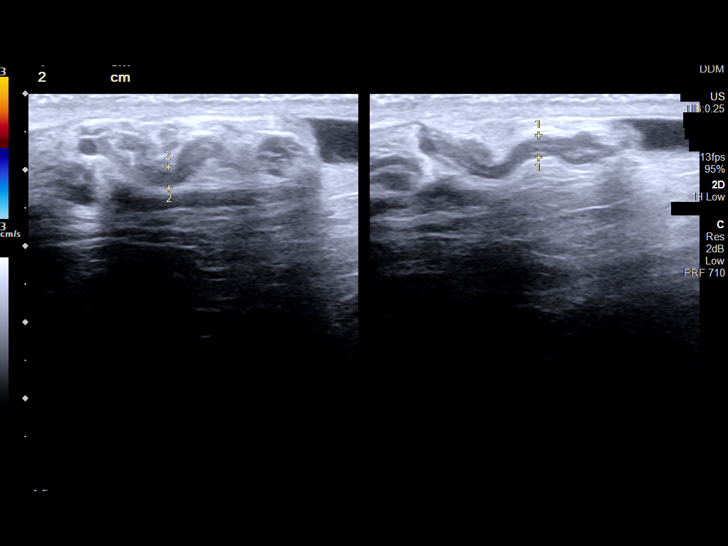
[im 48/64]
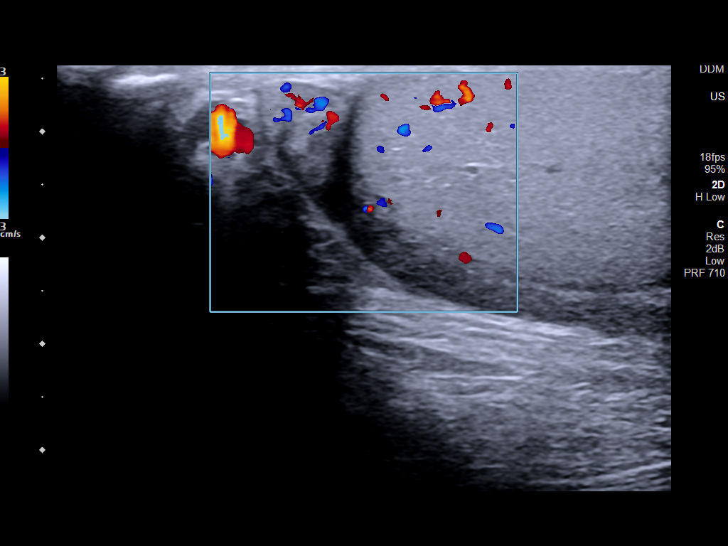
[im 53/64]
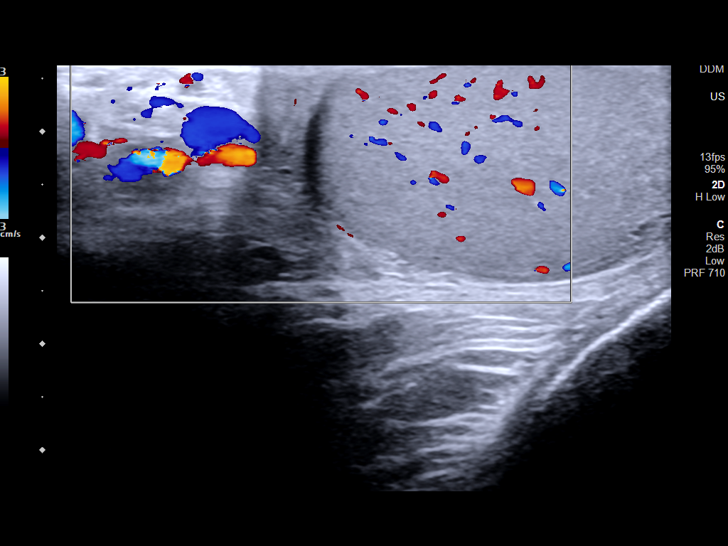
[im 58/64]
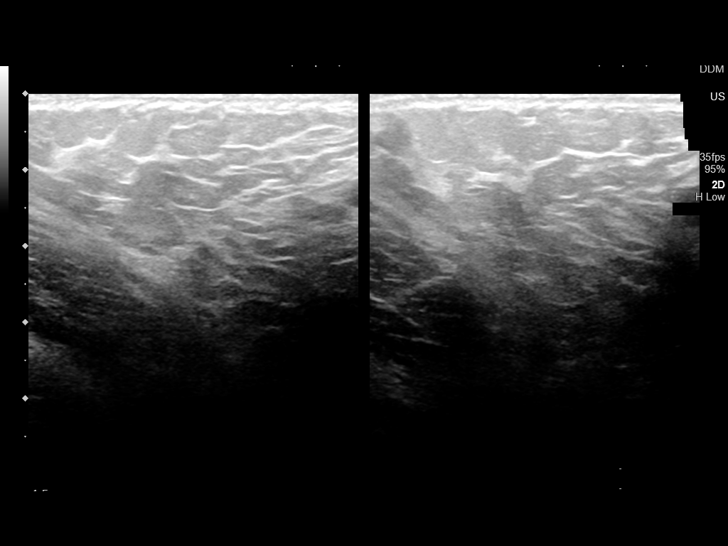
[im 64/64]
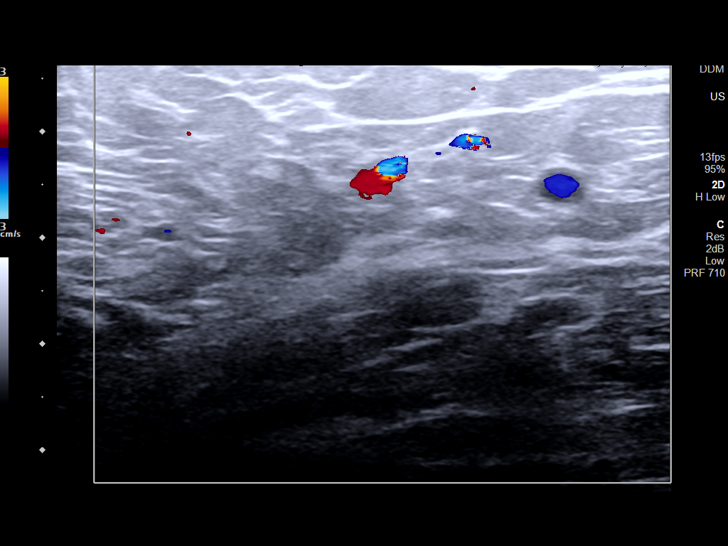

[14 of 25 positions shown; findings below may reference images not displayed]

FINDINGS: Right testicle

Measurements: 5.2 x 2.3 x 3.7 cm. No mass or microlithiasis
visualized.

Left testicle

Measurements: 4.9 x 2.2 x 3.2 cm. No mass or microlithiasis
visualized.

Right epididymis:  Normal in size and appearance.

Left epididymis:  Normal in size and appearance.

Hydrocele:  None visualized.

Varicocele:  None visualized.

Pulsed Doppler interrogation of both testes demonstrates normal low
resistance arterial and venous waveforms bilaterally.

Additional images were obtained of the bilateral inguinal regions
both at rest and during Valsalva. No hernia is identified.
IMPRESSION: 1. Negative scrotal ultrasound with no evidence for testicular
torsion or intratesticular mass.
2. No inguinal hernia was visualized.

## 2021-09-01 NOTE — Addendum Note (Signed)
Addended by: Gardner Candle on: 09/01/2021 11:46 AM   Modules accepted: Orders

## 2021-12-23 DIAGNOSIS — R1111 Vomiting without nausea: Secondary | ICD-10-CM | POA: Diagnosis not present

## 2021-12-23 DIAGNOSIS — R69 Illness, unspecified: Secondary | ICD-10-CM | POA: Diagnosis not present

## 2021-12-23 DIAGNOSIS — R0982 Postnasal drip: Secondary | ICD-10-CM | POA: Diagnosis not present

## 2021-12-23 DIAGNOSIS — K529 Noninfective gastroenteritis and colitis, unspecified: Secondary | ICD-10-CM | POA: Insufficient documentation

## 2021-12-24 DIAGNOSIS — K529 Noninfective gastroenteritis and colitis, unspecified: Secondary | ICD-10-CM | POA: Diagnosis not present

## 2021-12-30 DIAGNOSIS — Z789 Other specified health status: Secondary | ICD-10-CM | POA: Insufficient documentation

## 2021-12-30 DIAGNOSIS — F17209 Nicotine dependence, unspecified, with unspecified nicotine-induced disorders: Secondary | ICD-10-CM | POA: Insufficient documentation

## 2022-01-22 DIAGNOSIS — J301 Allergic rhinitis due to pollen: Secondary | ICD-10-CM | POA: Diagnosis not present

## 2022-01-26 DIAGNOSIS — J301 Allergic rhinitis due to pollen: Secondary | ICD-10-CM | POA: Diagnosis not present

## 2022-02-25 DIAGNOSIS — K2100 Gastro-esophageal reflux disease with esophagitis, without bleeding: Secondary | ICD-10-CM | POA: Diagnosis not present

## 2022-02-25 DIAGNOSIS — K635 Polyp of colon: Secondary | ICD-10-CM | POA: Diagnosis not present

## 2022-02-25 DIAGNOSIS — K449 Diaphragmatic hernia without obstruction or gangrene: Secondary | ICD-10-CM | POA: Diagnosis not present

## 2022-02-25 DIAGNOSIS — K2970 Gastritis, unspecified, without bleeding: Secondary | ICD-10-CM | POA: Diagnosis not present

## 2022-02-25 DIAGNOSIS — K269 Duodenal ulcer, unspecified as acute or chronic, without hemorrhage or perforation: Secondary | ICD-10-CM | POA: Diagnosis not present

## 2022-02-25 DIAGNOSIS — D122 Benign neoplasm of ascending colon: Secondary | ICD-10-CM | POA: Diagnosis not present

## 2022-02-25 DIAGNOSIS — D132 Benign neoplasm of duodenum: Secondary | ICD-10-CM | POA: Diagnosis not present

## 2022-02-25 DIAGNOSIS — D125 Benign neoplasm of sigmoid colon: Secondary | ICD-10-CM | POA: Diagnosis not present

## 2022-02-25 DIAGNOSIS — K5730 Diverticulosis of large intestine without perforation or abscess without bleeding: Secondary | ICD-10-CM | POA: Diagnosis not present

## 2022-02-25 DIAGNOSIS — K2960 Other gastritis without bleeding: Secondary | ICD-10-CM | POA: Diagnosis not present

## 2022-02-25 DIAGNOSIS — D123 Benign neoplasm of transverse colon: Secondary | ICD-10-CM | POA: Diagnosis not present

## 2022-02-25 DIAGNOSIS — K640 First degree hemorrhoids: Secondary | ICD-10-CM | POA: Diagnosis not present

## 2022-02-25 DIAGNOSIS — R197 Diarrhea, unspecified: Secondary | ICD-10-CM | POA: Diagnosis not present

## 2022-02-25 DIAGNOSIS — K529 Noninfective gastroenteritis and colitis, unspecified: Secondary | ICD-10-CM | POA: Diagnosis not present

## 2022-02-25 DIAGNOSIS — K644 Residual hemorrhoidal skin tags: Secondary | ICD-10-CM | POA: Diagnosis not present

## 2022-02-25 DIAGNOSIS — K3189 Other diseases of stomach and duodenum: Secondary | ICD-10-CM | POA: Diagnosis not present

## 2022-03-10 DIAGNOSIS — H524 Presbyopia: Secondary | ICD-10-CM | POA: Diagnosis not present

## 2022-05-28 DIAGNOSIS — E538 Deficiency of other specified B group vitamins: Secondary | ICD-10-CM | POA: Diagnosis not present

## 2022-05-28 DIAGNOSIS — K219 Gastro-esophageal reflux disease without esophagitis: Secondary | ICD-10-CM | POA: Diagnosis not present

## 2022-05-28 DIAGNOSIS — K269 Duodenal ulcer, unspecified as acute or chronic, without hemorrhage or perforation: Secondary | ICD-10-CM | POA: Diagnosis not present

## 2022-05-28 DIAGNOSIS — K529 Noninfective gastroenteritis and colitis, unspecified: Secondary | ICD-10-CM | POA: Diagnosis not present

## 2022-08-11 ENCOUNTER — Ambulatory Visit: Payer: Self-pay

## 2022-08-11 DIAGNOSIS — Z0000 Encounter for general adult medical examination without abnormal findings: Secondary | ICD-10-CM

## 2022-08-11 LAB — POCT URINALYSIS DIPSTICK
Bilirubin, UA: NEGATIVE
Blood, UA: NEGATIVE
Glucose, UA: NEGATIVE
Ketones, UA: NEGATIVE
Leukocytes, UA: NEGATIVE
Nitrite, UA: NEGATIVE
Protein, UA: NEGATIVE
Spec Grav, UA: 1.01 (ref 1.010–1.025)
Urobilinogen, UA: 0.2 E.U./dL
pH, UA: 6.5 (ref 5.0–8.0)

## 2022-08-11 NOTE — Progress Notes (Signed)
Pt presents today for physical labs, will return to clinic for scheduled physical./CL,RMA 

## 2022-08-12 LAB — CMP12+LP+TP+TSH+6AC+PSA+CBC…
ALT: 33 IU/L (ref 0–44)
AST: 35 IU/L (ref 0–40)
Albumin/Globulin Ratio: 2.4 — ABNORMAL HIGH (ref 1.2–2.2)
Albumin: 5 g/dL — ABNORMAL HIGH (ref 3.8–4.9)
Alkaline Phosphatase: 58 IU/L (ref 44–121)
BUN/Creatinine Ratio: 9 (ref 9–20)
BUN: 7 mg/dL (ref 6–24)
Basophils Absolute: 0.1 10*3/uL (ref 0.0–0.2)
Basos: 1 %
Bilirubin Total: 0.8 mg/dL (ref 0.0–1.2)
Calcium: 9.4 mg/dL (ref 8.7–10.2)
Chloride: 97 mmol/L (ref 96–106)
Chol/HDL Ratio: 2.2 ratio (ref 0.0–5.0)
Cholesterol, Total: 195 mg/dL (ref 100–199)
Creatinine, Ser: 0.74 mg/dL — ABNORMAL LOW (ref 0.76–1.27)
EOS (ABSOLUTE): 0.2 10*3/uL (ref 0.0–0.4)
Eos: 2 %
Estimated CHD Risk: <0.5 times avg. (ref 0.0–1.0)
Free Thyroxine Index: 1.9 (ref 1.2–4.9)
GGT: 34 IU/L (ref 0–65)
Globulin, Total: 2.1 g/dL (ref 1.5–4.5)
Glucose: 92 mg/dL (ref 70–99)
HDL: 87 mg/dL (ref >39)
Hematocrit: 44.2 % (ref 37.5–51.0)
Hemoglobin: 15.3 g/dL (ref 13.0–17.7)
Immature Grans (Abs): 0 10*3/uL (ref 0.0–0.1)
Immature Granulocytes: 0 %
Iron: 143 ug/dL (ref 38–169)
LDH: 236 IU/L — ABNORMAL HIGH (ref 121–224)
LDL Chol Calc (NIH): 99 mg/dL (ref 0–99)
Lymphocytes Absolute: 2.1 10*3/uL (ref 0.7–3.1)
Lymphs: 23 %
MCH: 33.8 pg — ABNORMAL HIGH (ref 26.6–33.0)
MCHC: 34.6 g/dL (ref 31.5–35.7)
MCV: 98 fL — ABNORMAL HIGH (ref 79–97)
Monocytes Absolute: 0.7 10*3/uL (ref 0.1–0.9)
Monocytes: 7 %
Neutrophils Absolute: 6.3 10*3/uL (ref 1.4–7.0)
Neutrophils: 67 %
Phosphorus: 4.3 mg/dL — ABNORMAL HIGH (ref 2.8–4.1)
Platelets: 233 10*3/uL (ref 150–450)
Potassium: 4.5 mmol/L (ref 3.5–5.2)
Prostate Specific Ag, Serum: 0.3 ng/mL (ref 0.0–4.0)
RBC: 4.52 x10E6/uL (ref 4.14–5.80)
RDW: 12.4 % (ref 11.6–15.4)
Sodium: 136 mmol/L (ref 134–144)
T3 Uptake Ratio: 30 % (ref 24–39)
T4, Total: 6.3 ug/dL (ref 4.5–12.0)
TSH: 2.74 u[IU]/mL (ref 0.450–4.500)
Total Protein: 7.1 g/dL (ref 6.0–8.5)
Triglycerides: 49 mg/dL (ref 0–149)
Uric Acid: 4.3 mg/dL (ref 3.8–8.4)
VLDL Cholesterol Cal: 9 mg/dL (ref 5–40)
WBC: 9.3 10*3/uL (ref 3.4–10.8)
eGFR: 108 mL/min/{1.73_m2} (ref >59)

## 2022-08-18 ENCOUNTER — Encounter: Payer: Self-pay | Admitting: Physician Assistant

## 2022-08-18 ENCOUNTER — Ambulatory Visit: Payer: Self-pay | Admitting: Physician Assistant

## 2022-08-18 VITALS — BP 144/70 | HR 90 | Temp 98.9°F | Resp 14 | Wt 193.0 lb

## 2022-08-18 DIAGNOSIS — Z0000 Encounter for general adult medical examination without abnormal findings: Secondary | ICD-10-CM

## 2022-08-18 NOTE — Progress Notes (Signed)
[facility]  ____________________________________________   None    (approximate)  I have reviewed the triage vital signs and the nursing notes.   HISTORY  Chief Complaint No chief complaint on file.   HPI Samuel Stevenson is a 51 y.o. male patient presents for annual physical exam.  Patient voiced no concerns or complaints.         Past Medical History:  Diagnosis Date   Allergy    Asthma    ED (erectile dysfunction)    Elevated glucose    Epicondylitis, lateral, right 2012   Hyperlipidemia    Overweight    Plantar fasciitis     There are no problems to display for this patient.   Past Surgical History:  Procedure Laterality Date   BACK SURGERY  2018   L4-L5    Prior to Admission medications   Medication Sig Start Date End Date Taking? Authorizing Provider  naproxen sodium (ALEVE) 220 MG tablet Take 440 mg by mouth daily as needed.    [provider]  sildenafil (REVATIO) 20 MG tablet TAKE 1-3 TABLETS BY MOUTH 30 MINUTES TO 4 HOURS PRIOR TO SEXUAL INTERCOURSE ONCE DAILY AS NEEDED 08/19/21   Sable Feil, PA-C    Allergies Patient has no known allergies.  No family history on file.  Social History Social History   Tobacco Use   Smoking status: Every Day    Packs/day: 1.00    Types: Cigarettes   Smokeless tobacco: Never  Vaping Use   Vaping Use: Never used  Substance Use Topics   Alcohol use: Yes    Review of Systems Constitutional: No fever/chills Eyes: No visual changes. ENT: No sore throat. Cardiovascular: Denies chest pain. Respiratory: Denies shortness of breath. Gastrointestinal: No abdominal pain.  No nausea, no vomiting.  No diarrhea.  No constipation. Genitourinary: Negative for dysuria. Musculoskeletal: Negative for back pain. Skin: Negative for rash. Neurological: Negative for headaches, focal weakness or numbness.  ____________________________________________   PHYSICAL  EXAM:  VITAL SIGNS: BP is 144/70, pulse 90, respiration 14, temperature 98.9, patient 1% O2 sat on room air.  Patient with 193 pounds and BMI is 26.92. Constitutional: Alert and oriented. Well appearing and in no acute distress. Eyes: Conjunctivae are normal. PERRL. EOMI. Head: Atraumatic. Nose: No congestion/rhinnorhea. Mouth/Throat: Mucous membranes are moist.  Oropharynx non-erythematous. Neck: No stridor.  No cervical spine tenderness to palpation. Hematological/Lymphatic/Immunilogical: No cervical lymphadenopathy. Cardiovascular: Normal rate, regular rhythm. Grossly normal heart sounds.  Good peripheral circulation. Respiratory: Normal respiratory effort.  No retractions. Lungs CTAB. Gastrointestinal: Soft and nontender. No distention. No abdominal bruits. No CVA tenderness. Genitourinary: Deferred Musculoskeletal: No lower extremity tenderness nor edema.  No joint effusions. Neurologic:  Normal speech and language. No gross focal neurologic deficits are appreciated. No gait instability. Skin:  Skin is warm, dry and intact. No rash noted. Psychiatric: Mood and affect are normal. Speech and behavior are normal.  ____________________________________________   LABS         Component Ref Range & Units 7 d ago (08/11/22) 1 yr ago (08/01/21) 2 yr ago (08/01/20) 2 yr ago (09/13/19)  Color, UA  yellow  yellow  Dark Yellow  Yellow   Clarity, UA  clear  clear  Clear  Clear   Glucose, UA Negative Negative  Negative  Negative  Negative   Bilirubin, UA  negative  negative  Negative  Negative   Ketones, UA  negative  negative  Positive CM  Negative  Spec Grav, UA 1.010 - 1.025 1.010  1.010  1.020  1.015   Blood, UA  negative  negative  Negative  Negative   pH, UA 5.0 - 8.0 6.5  6.0  6.0  6.0   Protein, UA Negative Negative  Negative  Negative  Negative   Urobilinogen, UA 0.2 or 1.0 E.U./dL 0.2  0.2  0.2  0.2   Nitrite, UA  negative  negative  Negative  Negative   Leukocytes, UA Negative  Negative  Negative  Negative  Negative   Appearance   light       Odor                          Other Results from 08/11/2022   Contains abnormal data CMP12+LP+TP+TSH+6AC+PSA+CBC. Order: [id number] Status: Final result    Visible to patient: Yes (not seen)    Next appt: None    Dx: Routine adult health maintenance    0 Result Notes          Component Ref Range & Units 7 d ago (08/11/22) 1 yr ago (08/01/21) 2 yr ago (08/01/20) 2 yr ago (12/27/19) 2 yr ago (12/27/19) 2 yr ago (09/13/19)  Glucose 70 - 99 mg/dL 92  93 CM  99 R    83 R   Uric Acid 3.8 - 8.4 mg/dL 4.3  4.5 CM  5.1 CM    5.3 R, CM   Comment:            Therapeutic target for gout patients: <6.0  BUN 6 - 24 mg/dL '7  6  6    6   ' Creatinine, Ser 0.76 - 1.27 mg/dL 0.74 Low   0.59 Low   0.82    0.65 Low    eGFR >59 mL/min/1.73 108  117       BUN/Creatinine Ratio 9 - '20 9  10  7 ' Low     9   Sodium 134 - 144 mmol/L 136  137  138    140   Potassium 3.5 - 5.2 mmol/L 4.5  4.7  4.9    4.3   Chloride 96 - 106 mmol/L 97  99  100    100   Calcium 8.7 - 10.2 mg/dL 9.4  9.4  9.4    9.3   Phosphorus 2.8 - 4.1 mg/dL 4.3 High   4.0  3.7    3.4   Total Protein 6.0 - 8.5 g/dL 7.1  6.7  7.2   6.8  7.0   Albumin 3.8 - 4.9 g/dL 5.0 High   5.0 High   4.8   4.6 R  5.1 High  R   Globulin, Total 1.5 - 4.5 g/dL 2.1  1.7  2.4    1.9   Albumin/Globulin Ratio 1.2 - 2.2 2.4 High   2.9 High   2.0    2.7 High    Bilirubin Total 0.0 - 1.2 mg/dL 0.8  0.6  0.6   0.4  0.5   Alkaline Phosphatase 44 - 121 IU/L 58  52  52 CM   55 R  55 R   LDH 121 - 224 IU/L 236 High   231 High   257 High     266 High    AST 0 - 40 IU/L 35  25  22   34  53 High    ALT 0 - 44 IU/L 33  26  18   26  49 High    GGT 0 - 65 IU/L 34  37  34    44   Iron 38 - 169 ug/dL 143  90  99    100   Cholesterol, Total 100 - 199 mg/dL 195  188  202 High     192   Triglycerides 0 - 149 mg/dL 49  53  46    44   HDL >39 mg/dL 87  77  76    97   VLDL Cholesterol Cal 5 - 40 mg/dL '9  10  9     9   ' LDL Chol Calc (NIH) 0 - 99 mg/dL 99  101 High   117 High     86   Chol/HDL Ratio 0.0 - 5.0 ratio 2.2  2.4 CM  2.7 CM    2.0 CM   Comment:                                   T. Chol/HDL Ratio                                              Men  Women                                1/2 Avg.Risk  3.4    3.3                                    Avg.Risk  5.0    4.4                                 2X Avg.Risk  9.6    7.1                                 3X Avg.Risk 23.4   11.0   Estimated CHD Risk 0.0 - 1.0 times avg.  < 0.5   < 0.5 CM   < 0.5 CM     < 0.5 CM   Comment: The CHD Risk is based on the T. Chol/HDL ratio. Other  factors affect CHD Risk such as hypertension, smoking,  diabetes, severe obesity, and family history of  premature CHD.   TSH 0.450 - 4.500 uIU/mL 2.740  1.540  1.760    1.800   T4, Total 4.5 - 12.0 ug/dL 6.3  5.7  5.5    5.4   T3 Uptake Ratio 24 - 39 % '30  31  28    29   ' Free Thyroxine Index 1.2 - 4.9 1.9  1.8  1.5    1.6   Prostate Specific Ag, Serum 0.0 - 4.0 ng/mL 0.3  0.4 CM  0.5 CM    0.6 CM   Comment: Roche ECLIA methodology.  According to the American Urological Association, Serum PSA should  decrease and remain at undetectable levels after radical  prostatectomy. The AUA defines biochemical recurrence as an initial  PSA value 0.2 ng/mL or greater followed by a subsequent confirmatory  PSA value 0.2 ng/mL or  greater.  Values obtained with different assay methods or kits cannot be used  interchangeably. Results cannot be interpreted as absolute evidence  of the presence or absence of malignant disease.   WBC 3.4 - 10.8 x10E3/uL 9.3  8.1  10.5  10.3   7.6   RBC 4.14 - 5.80 x10E6/uL 4.52  4.38  4.31  4.36   4.68   Hemoglobin 13.0 - 17.7 g/dL 15.3  15.1  14.8  14.9   15.5   Hematocrit 37.5 - 51.0 % 44.2  42.7  43.0  42.9   46.2   MCV 79 - 97 fL 98 High   98 High   100 High   98 High    99 High    MCH 26.6 - 33.0 pg 33.8 High   34.5 High   34.3 High   34.2 High     33.1 High    MCHC 31.5 - 35.7 g/dL 34.6  35.4  34.4  34.7   33.5   RDW 11.6 - 15.4 % 12.4  12.7  12.7  12.4   12.5   Platelets 150 - 450 x10E3/uL 233  225  248  237   205   Neutrophils Not Estab. % 67  63  66  66   72   Lymphs Not Estab. % '23  26  25  23   19   ' Monocytes Not Estab. % '7  6  5  6   6   ' Eos Not Estab. % '2  4  3  4   2   ' Basos Not Estab. % '1  1  1  1   1   ' Neutrophils Absolute 1.4 - 7.0 x10E3/uL 6.3  5.1  7.0  6.9   5.5   Lymphocytes Absolute 0.7 - 3.1 x10E3/uL 2.1  2.1  2.6  2.3   1.5   Monocytes Absolute 0.1 - 0.9 x10E3/uL 0.7  0.5  0.5  0.6   0.5   EOS (ABSOLUTE) 0.0 - 0.4 x10E3/uL 0.2  0.3  0.3  0.4   0.2   Basophils Absolute 0.0 - 0.2 x10E3/uL 0.1  0.0  0.1  0.1   0.1   Immature Granulocytes Not Estab. % 0  0  0  0   0   Immature Grans            ____________________________________________  EKG  Normal sinus rhythm at 68 bpm ____________________________________________    ____________________________________________   INITIAL IMPRESSION / ASSESSMENT AND PLAN  As part of my medical decision making, I reviewed the following data within the electronic MEDICAL RECORD NUMBER         Discussed no acute findings on EKG and lab results.     ____________________________________________   FINAL CLINICAL IMPRESSION  Well exam   ED Discharge Orders     None        Note:  This document was prepared using Dragon voice recognition software and may include unintentional dictation errors.

## 2022-08-18 NOTE — Progress Notes (Signed)
Here for yearly physical with provider with [facility].

## 2022-09-07 ENCOUNTER — Other Ambulatory Visit: Payer: Self-pay | Admitting: Physician Assistant

## 2022-09-07 DIAGNOSIS — N529 Male erectile dysfunction, unspecified: Secondary | ICD-10-CM

## 2022-09-08 ENCOUNTER — Other Ambulatory Visit: Payer: Self-pay

## 2022-09-08 NOTE — Progress Notes (Signed)
Reconciled problem list & medications

## 2022-09-22 ENCOUNTER — Other Ambulatory Visit: Payer: Self-pay

## 2022-09-22 DIAGNOSIS — N529 Male erectile dysfunction, unspecified: Secondary | ICD-10-CM

## 2022-09-23 MED ORDER — SILDENAFIL CITRATE 20 MG PO TABS: ORAL_TABLET | ORAL | 5 refills | Status: DC

## 2023-07-27 ENCOUNTER — Ambulatory Visit: Payer: Self-pay

## 2023-07-27 DIAGNOSIS — Z0000 Encounter for general adult medical examination without abnormal findings: Secondary | ICD-10-CM

## 2023-07-27 LAB — POCT URINALYSIS DIPSTICK
Bilirubin, UA: NEGATIVE
Blood, UA: NEGATIVE
Glucose, UA: NEGATIVE
Ketones, UA: NEGATIVE
Leukocytes, UA: NEGATIVE
Nitrite, UA: NEGATIVE
Protein, UA: NEGATIVE
Spec Grav, UA: <=1.005 — AB (ref 1.010–1.025)
Urobilinogen, UA: 0.2 U/dL
pH, UA: 6 (ref 5.0–8.0)

## 2023-07-28 LAB — CMP12+LP+TP+TSH+6AC+PSA+CBC…
ALT: 27 [IU]/L (ref 0–44)
AST: 35 [IU]/L (ref 0–40)
Albumin: 4.5 g/dL (ref 3.8–4.9)
Alkaline Phosphatase: 44 [IU]/L (ref 44–121)
BUN/Creatinine Ratio: 10 (ref 9–20)
BUN: 6 mg/dL (ref 6–24)
Basophils Absolute: 0 10*3/uL (ref 0.0–0.2)
Basos: 1 %
Bilirubin Total: 0.4 mg/dL (ref 0.0–1.2)
Calcium: 9.4 mg/dL (ref 8.7–10.2)
Chloride: 99 mmol/L (ref 96–106)
Chol/HDL Ratio: 1.8 {ratio} (ref 0.0–5.0)
Cholesterol, Total: 184 mg/dL (ref 100–199)
Creatinine, Ser: 0.58 mg/dL — ABNORMAL LOW (ref 0.76–1.27)
EOS (ABSOLUTE): 0.2 10*3/uL (ref 0.0–0.4)
Eos: 3 %
Estimated CHD Risk: <0.5 times avg. (ref 0.0–1.0)
Free Thyroxine Index: 1.6 (ref 1.2–4.9)
GGT: 32 [IU]/L (ref 0–65)
Globulin, Total: 2.3 g/dL (ref 1.5–4.5)
Glucose: 88 mg/dL (ref 70–99)
HDL: 101 mg/dL (ref >39)
Hematocrit: 42.5 % (ref 37.5–51.0)
Hemoglobin: 14.3 g/dL (ref 13.0–17.7)
Immature Grans (Abs): 0 10*3/uL (ref 0.0–0.1)
Immature Granulocytes: 0 %
Iron: 61 ug/dL (ref 38–169)
LDH: 242 [IU]/L — ABNORMAL HIGH (ref 121–224)
LDL Chol Calc (NIH): 74 mg/dL (ref 0–99)
Lymphocytes Absolute: 1.8 10*3/uL (ref 0.7–3.1)
Lymphs: 41 %
MCH: 33.6 pg — ABNORMAL HIGH (ref 26.6–33.0)
MCHC: 33.6 g/dL (ref 31.5–35.7)
MCV: 100 fL — ABNORMAL HIGH (ref 79–97)
Monocytes Absolute: 0.4 10*3/uL (ref 0.1–0.9)
Monocytes: 8 %
Neutrophils Absolute: 2.1 10*3/uL (ref 1.4–7.0)
Neutrophils: 47 %
Phosphorus: 3.8 mg/dL (ref 2.8–4.1)
Platelets: 156 10*3/uL (ref 150–450)
Potassium: 4.5 mmol/L (ref 3.5–5.2)
Prostate Specific Ag, Serum: 0.6 ng/mL (ref 0.0–4.0)
RBC: 4.26 x10E6/uL (ref 4.14–5.80)
RDW: 12.8 % (ref 11.6–15.4)
Sodium: 140 mmol/L (ref 134–144)
T3 Uptake Ratio: 29 % (ref 24–39)
T4, Total: 5.4 ug/dL (ref 4.5–12.0)
TSH: 1.8 u[IU]/mL (ref 0.450–4.500)
Total Protein: 6.8 g/dL (ref 6.0–8.5)
Triglycerides: 41 mg/dL (ref 0–149)
Uric Acid: 4.6 mg/dL (ref 3.8–8.4)
VLDL Cholesterol Cal: 9 mg/dL (ref 5–40)
WBC: 4.5 10*3/uL (ref 3.4–10.8)
eGFR: 116 mL/min/{1.73_m2} (ref >59)

## 2023-08-02 ENCOUNTER — Encounter: Payer: Self-pay | Admitting: Physician Assistant

## 2023-08-02 ENCOUNTER — Ambulatory Visit: Payer: Self-pay | Admitting: Physician Assistant

## 2023-08-02 VITALS — BP 158/84 | HR 82 | Resp 14 | Ht 71.0 in | Wt 180.0 lb

## 2023-08-02 DIAGNOSIS — Z0000 Encounter for general adult medical examination without abnormal findings: Secondary | ICD-10-CM

## 2023-08-02 NOTE — Progress Notes (Signed)
Pt presents today to complete physical, pt denies any complaints or concerns at this time Samuel Stevenson

## 2023-08-02 NOTE — Progress Notes (Signed)
[facility] Emergency Department Provider Note  ____________________________________________   None    (approximate)  I have reviewed the triage vital signs and the nursing notes.   HISTORY  Chief Complaint Annual Exam  { HPI Samuel Stevenson is a 52 y.o. male patient presents for annual physical exam.  Patient with no concerning complaints.  Patient requires sublingual sildenafil.        { Past Medical History:  Diagnosis Date   Allergy    Asthma    ED (erectile dysfunction)    Elevated glucose    Epicondylitis, lateral, right 2012   Hyperlipidemia    Overweight    Plantar fasciitis     Patient Active Problem List   Diagnosis Date Noted   Alcohol consumption of more than four drinks per day 12/30/2021   Tobacco use disorder, continuous 12/30/2021   Chronic diarrhea of unknown origin 12/23/2021   Post-nasal drainage 12/23/2021   Vomiting without nausea 12/23/2021    Past Surgical History:  Procedure Laterality Date   BACK SURGERY  2018   L4-L5    Prior to Admission medications   Medication Sig Start Date End Date Taking? Authorizing Provider  naproxen sodium (ALEVE) 220 MG tablet Take 440 mg by mouth daily as needed.   Yes [provider]  sildenafil (REVATIO) 20 MG tablet TAKE 1-3 TABLETS BY MOUTH 30 MINUTES TO 4 HOURS PRIOR TO SEXUAL INTERCOURSE ONCE DAILY AS NEEDED 09/23/22  Yes Joni Reining, PA-C    Allergies Patient has no known allergies.  No family history on file.  Social History Social History   Tobacco Use   Smoking status: Every Day    Current packs/day: 1.00    Types: Cigarettes   Smokeless tobacco: Never  Vaping Use   Vaping status: Never Used  Substance Use Topics   Alcohol use: Yes    Review of Systems Constitutional: No fever/chills Eyes: No visual changes. ENT: No sore throat. Cardiovascular: Denies chest pain. Respiratory: Denies shortness of breath. Gastrointestinal: No abdominal pain.   No nausea, no vomiting.  No diarrhea.  No constipation. Genitourinary: Negative for dysuria.  Erectile dysfunction Musculoskeletal: Negative for back pain. Skin: Negative for rash. Neurological: Negative for headaches, focal weakness or numbness.  ____________________________________________   PHYSICAL EXAM:  VITAL SIGNS: BP 164/94 158/84  Pulse 61 82  Resp 14   SpO2  100 %  Weight 180 lb (81.6 kg)   Height 5\' 11"  (1.803 m)    BMI 25.10 kg/m2  BSA 2.02 m2    Constitutional: Alert and oriented. Well appearing and in no acute distress. Eyes: Conjunctivae are normal. PERRL. EOMI. Head: Atraumatic. Nose: No congestion/rhinnorhea. Mouth/Throat: Mucous membranes are moist.  Oropharynx non-erythematous. Neck: No stridor.  No cervical spine tenderness to palpation. Hematological/Lymphatic/Immunilogical: No cervical lymphadenopathy. Cardiovascular: Normal rate, regular rhythm. Grossly normal heart sounds.  Good peripheral circulation. Respiratory: Normal respiratory effort.  No retractions. Lungs CTAB. Gastrointestinal: Soft and nontender. No distention. No abdominal bruits. No CVA tenderness. Genitourinary: Deferred Musculoskeletal: No lower extremity tenderness nor edema.  No joint effusions. Neurologic:  Normal speech and language. No gross focal neurologic deficits are appreciated. No gait instability. Skin:  Skin is warm, dry and intact. No rash noted. Psychiatric: Mood and affect are normal. Speech and behavior are normal.  ____________________________________________   LABS         Component Ref Range & Units 6 d ago (07/27/23) 11 mo ago (08/11/22) 2 yr ago (08/01/21) 3 yr ago (08/01/20)  3 yr ago (09/13/19)  Color, UA Yellow yellow yellow Dark Yellow Yellow  Clarity, UA Clear clear clear Clear Clear  Glucose, UA Negative Negative Negative Negative Negative Negative  Bilirubin, UA Negative negative negative Negative Negative  Ketones, UA Negative negative negative  Positive CM Negative  Spec Grav, UA 1.010 - 1.025 <=1.005 Abnormal  1.010 1.010 1.020 1.015  Blood, UA Negative negative negative Negative Negative  pH, UA 5.0 - 8.0 6.0 6.5 6.0 6.0 6.0  Protein, UA Negative Negative Negative Negative Negative Negative  Urobilinogen, UA 0.2 or 1.0 E.U./dL 0.2 0.2 0.2 0.2 0.2  Nitrite, UA Negative negative negative Negative Negative  Leukocytes, UA Negative Negative Negative Negative Negative Negative  Appearance   light    Odor              Specimen Collected: 07/27/23 08:48 Last Resulted: 07/27/23 08:48      Lab Flowsheet      Order Details      View Encounter      Lab and Collection Details      Routing      Result History    View All Conversations on this Encounter      CM=Additional comments      Result Care Coordination   Patient Communication   Add Comments   Add Notifications  Back to Top    Other Results from 07/27/2023   Contains abnormal data CMP12+LP+TP+TSH+6AC+PSA+CBC. Order: [id number] Status: Final result      Visible to patient: Yes (not seen)      Next appt: None      Dx: Annual physical exam    0 Result Notes            Component Ref Range & Units 6 d ago (07/27/23) 11 mo ago (08/11/22) 2 yr ago (08/01/21) 3 yr ago (08/01/20) 3 yr ago (12/27/19) 3 yr ago (12/27/19) 3 yr ago (09/13/19)  Glucose 70 - 99 mg/dL 88 92 93 CM 99 R   83 R  Uric Acid 3.8 - 8.4 mg/dL 4.6 4.3 CM 4.5 CM 5.1 CM   5.3 R, CM  Comment:            Therapeutic target for gout patients: <6.0  BUN 6 - 24 mg/dL 6 7 6 6   6   Creatinine, Ser 0.76 - 1.27 mg/dL 4.09 Low  8.11 Low  9.14 Low  0.82   0.65 Low   eGFR >59 mL/min/1.73 116 108 117      BUN/Creatinine Ratio 9 - 20 10 9 10 7  Low    9  Sodium 134 - 144 mmol/L 140 136 137 138   140  Potassium 3.5 - 5.2 mmol/L 4.5 4.5 4.7 4.9   4.3  Chloride 96 - 106 mmol/L 99 97 99 100   100  Calcium 8.7 - 10.2 mg/dL 9.4 9.4 9.4 9.4   9.3  Phosphorus 2.8 - 4.1 mg/dL 3.8 4.3 High  4.0  3.7   3.4  Total Protein 6.0 - 8.5 g/dL 6.8 7.1 6.7 7.2  6.8 7.0  Albumin 3.8 - 4.9 g/dL 4.5 5.0 High  5.0 High  4.8  4.6 R 5.1 High  R  Globulin, Total 1.5 - 4.5 g/dL 2.3 2.1 1.7 2.4   1.9  Bilirubin Total 0.0 - 1.2 mg/dL 0.4 0.8 0.6 0.6  0.4 0.5  Alkaline Phosphatase 44 - 121 IU/L 44 58 52 52 CM  55 R 55 R  LDH 121 - 224 IU/L 242 High  236 High  231 High  257 High    266 High   AST 0 - 40 IU/L 35 35 25 22  34 53 High   ALT 0 - 44 IU/L 27 33 26 18  26  49 High   GGT 0 - 65 IU/L 32 34 37 34   44  Iron 38 - 169 ug/dL 61 664 90 99   403  Cholesterol, Total 100 - 199 mg/dL 474 259 563 875 High    192  Triglycerides 0 - 149 mg/dL 41 49 53 46   44  HDL >64 mg/dL 332 87 77 76   97  VLDL Cholesterol Cal 5 - 40 mg/dL 9 9 10 9   9   LDL Chol Calc (NIH) 0 - 99 mg/dL 74 99 951 High  884 High    86  Chol/HDL Ratio 0.0 - 5.0 ratio 1.8 2.2 CM 2.4 CM 2.7 CM   2.0 CM  Comment:                                   T. Chol/HDL Ratio                                             Men  Women                               1/2 Avg.Risk  3.4    3.3                                   Avg.Risk  5.0    4.4                                2X Avg.Risk  9.6    7.1                                3X Avg.Risk 23.4   11.0  Estimated CHD Risk 0.0 - 1.0 times avg.  < 0.5  < 0.5 CM  < 0.5 CM  < 0.5 CM    < 0.5 CM  Comment: The CHD Risk is based on the T. Chol/HDL ratio. Other factors affect CHD Risk such as hypertension, smoking, diabetes, severe obesity, and family history of premature CHD.  TSH 0.450 - 4.500 uIU/mL 1.800 2.740 1.540 1.760   1.800  T4, Total 4.5 - 12.0 ug/dL 5.4 6.3 5.7 5.5   5.4  T3 Uptake Ratio 24 - 39 % 29 30 31 28   29   Free Thyroxine Index 1.2 - 4.9 1.6 1.9 1.8 1.5   1.6  Prostate Specific Ag, Serum 0.0 - 4.0 ng/mL 0.6 0.3 CM 0.4 CM 0.5 CM   0.6 CM  Comment: Roche ECLIA methodology. According to the American Urological Association, Serum PSA should decrease and remain at  undetectable levels after radical prostatectomy. The AUA defines biochemical recurrence as an initial PSA value 0.2 ng/mL or greater followed by a subsequent confirmatory PSA value 0.2 ng/mL or greater. Values obtained with different assay methods or kits cannot be used  interchangeably. Results cannot be interpreted as absolute evidence of the presence or absence of malignant disease.  WBC 3.4 - 10.8 x10E3/uL 4.5 9.3 8.1 10.5 10.3  7.6  RBC 4.14 - 5.80 x10E6/uL 4.26 4.52 4.38 4.31 4.36  4.68  Hemoglobin 13.0 - 17.7 g/dL 75.6 43.3 29.5 18.8 41.6  15.5  Hematocrit 37.5 - 51.0 % 42.5 44.2 42.7 43.0 42.9  46.2  MCV 79 - 97 fL 100 High  98 High  98 High  100 High  98 High   99 High   MCH 26.6 - 33.0 pg 33.6 High  33.8 High  34.5 High  34.3 High  34.2 High   33.1 High   MCHC 31.5 - 35.7 g/dL 60.6 30.1 60.1 09.3 23.5  33.5  RDW 11.6 - 15.4 % 12.8 12.4 12.7 12.7 12.4  12.5  Platelets 150 - 450 x10E3/uL 156 233 225 248 237  205  Neutrophils Not Estab. % 47 67 63 66 66  72  Lymphs Not Estab. % 41 23 26 25 23  19   Monocytes Not Estab. % 8 7 6 5 6  6   Eos Not Estab. % 3 2 4 3 4  2   Basos Not Estab. % 1 1 1 1 1  1   Neutrophils Absolute 1.4 - 7.0 x10E3/uL 2.1 6.3 5.1 7.0 6.9  5.5  Lymphocytes Absolute 0.7 - 3.1 x10E3/uL 1.8 2.1 2.1 2.6 2.3  1.5  Monocytes Absolute 0.1 - 0.9 x10E3/uL 0.4 0.7 0.5 0.5 0.6  0.5  EOS (ABSOLUTE) 0.0 - 0.4 x10E3/uL 0.2 0.2 0.3 0.3 0.4  0.2  Basophils Absolute 0.0 - 0.2 x10E3/uL 0.0 0.1 0.0 0.1 0.1  0.1  Immature Granulocytes Not Estab. % 0 0 0 0 0  0  Immature Grans (Abs)              ____________________________________________  EKG  Sinus rhythm at 77 bpm.  Anterior and follow-up, age undetermined.  Asymptomatic. ____________________________________________   ____________________________________________     ____________________________________________   INITIAL IMPRESSION / ASSESSMENT AND PLAN  As part of my medical decision making,  I reviewed the following data within the electronic MEDICAL RECORD NUMBER       No acute findings on physical exam.     ____________________________________________   FINAL CLINICAL IMPRESSION  Well exam   ED Discharge Orders     None        Note:  This document was prepared using Dragon voice recognition software and may include unintentional dictation errors.

## 2023-09-28 ENCOUNTER — Other Ambulatory Visit: Payer: Self-pay | Admitting: Physician Assistant

## 2023-09-28 DIAGNOSIS — N529 Male erectile dysfunction, unspecified: Secondary | ICD-10-CM

## 2023-09-29 ENCOUNTER — Other Ambulatory Visit: Payer: Self-pay

## 2023-09-29 DIAGNOSIS — N529 Male erectile dysfunction, unspecified: Secondary | ICD-10-CM

## 2023-09-29 MED ORDER — SILDENAFIL CITRATE 20 MG PO TABS: ORAL_TABLET | ORAL | 5 refills | Status: DC

## 2023-11-09 DIAGNOSIS — M9901 Segmental and somatic dysfunction of cervical region: Secondary | ICD-10-CM | POA: Diagnosis not present

## 2023-11-09 DIAGNOSIS — S335XXA Sprain of ligaments of lumbar spine, initial encounter: Secondary | ICD-10-CM | POA: Diagnosis not present

## 2023-11-09 DIAGNOSIS — M9905 Segmental and somatic dysfunction of pelvic region: Secondary | ICD-10-CM | POA: Diagnosis not present

## 2023-11-09 DIAGNOSIS — M9903 Segmental and somatic dysfunction of lumbar region: Secondary | ICD-10-CM | POA: Diagnosis not present

## 2023-11-09 DIAGNOSIS — M9904 Segmental and somatic dysfunction of sacral region: Secondary | ICD-10-CM | POA: Diagnosis not present

## 2023-11-09 DIAGNOSIS — M9902 Segmental and somatic dysfunction of thoracic region: Secondary | ICD-10-CM | POA: Diagnosis not present

## 2023-11-11 DIAGNOSIS — M9902 Segmental and somatic dysfunction of thoracic region: Secondary | ICD-10-CM | POA: Diagnosis not present

## 2023-11-11 DIAGNOSIS — S335XXA Sprain of ligaments of lumbar spine, initial encounter: Secondary | ICD-10-CM | POA: Diagnosis not present

## 2023-11-11 DIAGNOSIS — M9901 Segmental and somatic dysfunction of cervical region: Secondary | ICD-10-CM | POA: Diagnosis not present

## 2023-11-11 DIAGNOSIS — M9905 Segmental and somatic dysfunction of pelvic region: Secondary | ICD-10-CM | POA: Diagnosis not present

## 2023-11-11 DIAGNOSIS — M9904 Segmental and somatic dysfunction of sacral region: Secondary | ICD-10-CM | POA: Diagnosis not present

## 2023-11-11 DIAGNOSIS — M9903 Segmental and somatic dysfunction of lumbar region: Secondary | ICD-10-CM | POA: Diagnosis not present

## 2024-06-29 ENCOUNTER — Ambulatory Visit: Payer: Self-pay

## 2024-06-29 DIAGNOSIS — Z0000 Encounter for general adult medical examination without abnormal findings: Secondary | ICD-10-CM

## 2024-06-29 LAB — POCT URINALYSIS DIPSTICK
Bilirubin, UA: NEGATIVE
Glucose, UA: NEGATIVE
Ketones, UA: NEGATIVE
Leukocytes, UA: NEGATIVE
Nitrite, UA: NEGATIVE
Protein, UA: NEGATIVE
Spec Grav, UA: 1.01 (ref 1.010–1.025)
Urobilinogen, UA: 0.2 U/dL
pH, UA: 6 (ref 5.0–8.0)

## 2024-06-29 NOTE — Addendum Note (Signed)
 Addended by: CINDIE SMILES F on: 06/29/2024 01:16 PM   Modules accepted: Orders

## 2024-06-30 LAB — CMP12+LP+TP+TSH+6AC+PSA+CBC…
ALT: 33 IU/L (ref 0–44)
AST: 40 IU/L (ref 0–40)
Albumin: 4.5 g/dL (ref 3.8–4.9)
Alkaline Phosphatase: 51 IU/L (ref 44–121)
BUN/Creatinine Ratio: 7 — ABNORMAL LOW (ref 9–20)
BUN: 5 mg/dL — ABNORMAL LOW (ref 6–24)
Basophils Absolute: 0.1 x10E3/uL (ref 0.0–0.2)
Basos: 1 %
Bilirubin Total: 0.3 mg/dL (ref 0.0–1.2)
Calcium: 9.2 mg/dL (ref 8.7–10.2)
Chloride: 98 mmol/L (ref 96–106)
Chol/HDL Ratio: 2.1 ratio (ref 0.0–5.0)
Cholesterol, Total: 199 mg/dL (ref 100–199)
Creatinine, Ser: 0.67 mg/dL — ABNORMAL LOW (ref 0.76–1.27)
EOS (ABSOLUTE): 0.1 x10E3/uL (ref 0.0–0.4)
Eos: 2 %
Estimated CHD Risk: <0.5 times avg. (ref 0.0–1.0)
Free Thyroxine Index: 1.7 (ref 1.2–4.9)
GGT: 67 IU/L — ABNORMAL HIGH (ref 0–65)
Globulin, Total: 2.5 g/dL (ref 1.5–4.5)
Glucose: 81 mg/dL (ref 70–99)
HDL: 95 mg/dL (ref >39)
Hematocrit: 46.2 % (ref 37.5–51.0)
Hemoglobin: 15.3 g/dL (ref 13.0–17.7)
Immature Grans (Abs): 0 x10E3/uL (ref 0.0–0.1)
Immature Granulocytes: 0 %
Iron: 119 ug/dL (ref 38–169)
LDH: 238 IU/L — ABNORMAL HIGH (ref 121–224)
LDL Chol Calc (NIH): 94 mg/dL (ref 0–99)
Lymphocytes Absolute: 2.5 x10E3/uL (ref 0.7–3.1)
Lymphs: 51 %
MCH: 34.8 pg — ABNORMAL HIGH (ref 26.6–33.0)
MCHC: 33.1 g/dL (ref 31.5–35.7)
MCV: 105 fL — ABNORMAL HIGH (ref 79–97)
Monocytes Absolute: 0.4 x10E3/uL (ref 0.1–0.9)
Monocytes: 8 %
Neutrophils Absolute: 1.9 x10E3/uL (ref 1.4–7.0)
Neutrophils: 38 %
Phosphorus: 4.1 mg/dL (ref 2.8–4.1)
Platelets: 192 x10E3/uL (ref 150–450)
Potassium: 4.6 mmol/L (ref 3.5–5.2)
Prostate Specific Ag, Serum: 0.6 ng/mL (ref 0.0–4.0)
RBC: 4.4 x10E6/uL (ref 4.14–5.80)
RDW: 13.2 % (ref 11.6–15.4)
Sodium: 138 mmol/L (ref 134–144)
T3 Uptake Ratio: 28 % (ref 24–39)
T4, Total: 6 ug/dL (ref 4.5–12.0)
TSH: 2.19 u[IU]/mL (ref 0.450–4.500)
Total Protein: 7 g/dL (ref 6.0–8.5)
Triglycerides: 51 mg/dL (ref 0–149)
Uric Acid: 4.6 mg/dL (ref 3.8–8.4)
VLDL Cholesterol Cal: 10 mg/dL (ref 5–40)
WBC: 5 x10E3/uL (ref 3.4–10.8)
eGFR: 110 mL/min/1.73 (ref >59)

## 2024-07-03 ENCOUNTER — Ambulatory Visit: Payer: Self-pay

## 2024-07-03 VITALS — BP 160/76

## 2024-07-03 DIAGNOSIS — Z0130 Encounter for examination of blood pressure without abnormal findings: Secondary | ICD-10-CM

## 2024-07-03 NOTE — Progress Notes (Signed)
 Presents for BP check.  States BP has been running high & has his physical scheduled for 07/06/2024 with  Renay Sharps, PA-C at [facility].  Getting 3 days of BP checks before the physical.  Hx:  Smoker - smoke cigarette on the drive to the clinic Last ate at 9:30 am Came up the stairs to the clinic Mother passed away from a heart attack Father - HTN & on medication Brother - Heart Valve Replacement 3 years ago  States past weeks noticed some heart palpitations (flutters), slight fatigue and hands a little shaky in the mornings.  Denies vision changes.  Will return to clinic tomorrow for BP check.  Encouraged not to smoke on the way to the clinic & use the elevator (not the stairs) in the [facility].

## 2024-07-04 ENCOUNTER — Encounter: Payer: Self-pay | Admitting: Physician Assistant

## 2024-07-04 ENCOUNTER — Ambulatory Visit: Payer: Self-pay | Admitting: Physician Assistant

## 2024-07-04 VITALS — BP 180/80 | HR 65 | Resp 16 | Ht 71.0 in | Wt 185.0 lb

## 2024-07-04 DIAGNOSIS — I10 Essential (primary) hypertension: Secondary | ICD-10-CM | POA: Insufficient documentation

## 2024-07-04 DIAGNOSIS — Z0000 Encounter for general adult medical examination without abnormal findings: Secondary | ICD-10-CM

## 2024-07-04 MED ORDER — LISINOPRIL 10 MG PO TABS: 10.0000 mg | ORAL_TABLET | Freq: Every day | ORAL | 0 refills | Status: DC

## 2024-07-04 NOTE — Addendum Note (Signed)
 Addended by: ELUTERIO JENKINS HERO on: 07/04/2024 11:49 AM   Modules accepted: Orders

## 2024-07-04 NOTE — Progress Notes (Signed)
 [facility] ____________________________________________   None    (approximate)  I have reviewed the triage vital signs and the nursing notes.   HISTORY  Chief Complaint No chief complaint on file.   HPI Samuel Stevenson is a 53 y.o. male patient presents for annual physical exam.  Voices concern for heart palpitations.  Patient states he has noticed increased heart palpitation in the past 3 weeks.  Denies dyspnea, vertigo, or weakness.  Prior to today's visit patient had a 3-day blood pressure check with an average of 178/78.  Patient relates to full alcoholic drinks per day.         Past Medical History:  Diagnosis Date   Allergy    Asthma    ED (erectile dysfunction)    Elevated glucose    Epicondylitis, lateral, right 2012   Hyperlipidemia    Overweight    Plantar fasciitis     Patient Active Problem List   Diagnosis Date Noted   Alcohol consumption of more than four drinks per day 12/30/2021   Tobacco use disorder, continuous 12/30/2021   Chronic diarrhea of unknown origin 12/23/2021   Post-nasal drainage 12/23/2021   Vomiting without nausea 12/23/2021    Past Surgical History:  Procedure Laterality Date   BACK SURGERY  2018   L4-L5    Prior to Admission medications   Medication Sig Start Date End Date Taking? Authorizing Provider  naproxen sodium (ALEVE) 220 MG tablet Take 440 mg by mouth daily as needed.    [provider]  sildenafil  (REVATIO ) 20 MG tablet TAKE 1-3 TABLETS BY MOUTH 30 MINUTES TO 4 HOURS PRIOR TO SEXUAL INTERCOURSE ONCE DAILY AS NEEDED 09/29/23   Claudene Tanda POUR, PA-C    Allergies Patient has no known allergies.  No family history on file.  Social History Social History   Tobacco Use   Smoking status: Every Day    Current packs/day: 1.00    Types: Cigarettes   Smokeless tobacco: Never  Vaping Use   Vaping status: Never Used  Substance Use Topics   Alcohol use: Yes    Review of  Systems Constitutional: No fever/chills Eyes: No visual changes. ENT: No sore throat. Cardiovascular: Denies chest pain. Respiratory: Denies shortness of breath. Gastrointestinal: No abdominal pain.  No nausea, no vomiting.  No diarrhea.  No constipation. Genitourinary: Negative for dysuria.  Erectile dysfunction Musculoskeletal: Negative for back pain. Skin: Negative for rash. Neurological: Negative for headaches, focal weakness or numbness.  ____________________________________________   PHYSICAL EXAM:  VITAL SIGNS: BP 180/80  Cuff Size Normal  Pulse Rate 65  Weight 185 lb (83.9 kg)  Height 5' 11 (1.803 m)  Resp 16  SpO2 100 %   Other Vitals   BMI: 25.80 kg/m2  BSA: 2.05 m2    Constitutional: Alert and oriented. Well appearing and in no acute distress. Eyes: Conjunctivae are normal. PERRL. EOMI. Head: Atraumatic. Nose: No congestion/rhinnorhea. Mouth/Throat: Mucous membranes are moist.  Oropharynx non-erythematous. Hematological/Lymphatic/Immunilogical: No cervical lymphadenopathy. Cardiovascular: Normal rate, regular rhythm. Grossly normal heart sounds.  Good peripheral circulation. Respiratory: Normal respiratory effort.  No retractions. Lungs CTAB. Gastrointestinal: Soft and nontender. No distention. No abdominal bruits. No CVA tenderness. Genitourinary: Deferred Musculoskeletal: No lower extremity tenderness nor edema.  No joint effusions. Neurologic:  Normal speech and language. No gross focal neurologic deficits are appreciated. No gait instability. Skin:  Skin is warm, dry and intact. No rash noted. Psychiatric: Mood and affect are normal. Speech and behavior are normal.  ____________________________________________   LABS          Component Ref Range & Units (hover) 5 d ago 11 mo ago 1 yr ago 2 yr ago 3 yr ago 4 yr ago  Color, UA yellow Yellow yellow yellow Dark Yellow Yellow  Clarity, UA clear Clear clear clear Clear Clear  Glucose, UA Negative  Negative Negative Negative Negative Negative  Bilirubin, UA neg Negative negative negative Negative Negative  Ketones, UA neg Negative negative negative Positive CM Negative  Spec Grav, UA 1.010 <=1.005 Abnormal  1.010 1.010 1.020 1.015  Blood, UA trac -+ Abnormal  Negative negative negative Negative Negative  pH, UA 6.0 6.0 6.5 6.0 6.0 6.0  Protein, UA Negative Negative Negative Negative Negative Negative  Urobilinogen, UA 0.2 0.2 0.2 0.2 0.2 0.2  Nitrite, UA neg Negative negative negative Negative Negative  Leukocytes, UA Negative Negative Negative Negative Negative Negative  Appearance    light    Odor                     Component Ref Range & Units (hover) 5 d ago (06/29/24) 11 mo ago (07/27/23) 1 yr ago (08/11/22) 2 yr ago (08/01/21) 3 yr ago (08/01/20) 4 yr ago (12/27/19) 4 yr ago (12/27/19)  Glucose 81 88 92 93 CM 99 R    Uric Acid 4.6 4.6 CM 4.3 CM 4.5 CM 5.1 CM    Comment:            Therapeutic target for gout patients: <6.0  BUN 5 Low  6 7 6 6     Creatinine, Ser 0.67 Low  0.58 Low  0.74 Low  0.59 Low  0.82    eGFR 110 116 108 117     BUN/Creatinine Ratio 7 Low  10 9 10 7  Low     Sodium 138 140 136 137 138    Potassium 4.6 4.5 4.5 4.7 4.9    Chloride 98 99 97 99 100    Calcium 9.2 9.4 9.4 9.4 9.4    Phosphorus 4.1 3.8 4.3 High  4.0 3.7    Total Protein 7.0 6.8 7.1 6.7 7.2  6.8  Albumin 4.5 4.5 5.0 High  5.0 High  4.8  4.6 R  Globulin, Total 2.5 2.3 2.1 1.7 2.4    Bilirubin Total 0.3 0.4 0.8 0.6 0.6  0.4  Alkaline Phosphatase 51 44 58 52 52 CM  55 R  Comment: **Effective July 10, 2024 Alkaline Phosphatase**   reference interval will be changing to:              Age                Male          Male           0 -  5 days         47 - 127       47 - 127           6 - 10 days         29 - 242       29 - 242          11 - 20 days        109 - 357      109 - 357          21 - 30 days         94 - 494  94 - 494           1 -  2 months      149 - 539      149 -  539           3 -  6 months      131 - 452      131 - 452           7 - 11 months      117 - 401      117 - 401   12 months -  6 years       158 - 369      158 - 369           7 - 12 years       150 - 409      150 - 409               13 years       156 - 435       78 - 227               14 years       114 - 375       64 - 161               15 years        88 - 279       56 - 134               16 years        74 - 207       51 - 121               17 years        63 - 161       47 - 113          18 - 20 years        51 - 125       42 - 106          21 - 50 years        47 - 123       41 - 116          51 - 80 years        49 - 135       51 - 125              >80 years        48 - 129       48 - 129  LDH 238 High  242 High  236 High  231 High  257 High     AST 40 35 35 25 22  34  ALT 33 27 33 26 18  26   GGT 67 High  32 34 37 34    Iron 119 61 143 90 99    Cholesterol, Total 199 184 195 188 202 High     Triglycerides 51 41 49 53 46    HDL 95 101 87 77 76    VLDL Cholesterol Cal 10 9 9 10 9     LDL Chol Calc (NIH) 94 74 99 101 High  117 High     Chol/HDL Ratio 2.1 1.8 CM 2.2 CM 2.4 CM 2.7 CM    Comment:  T. Chol/HDL Ratio                                             Men  Women                               1/2 Avg.Risk  3.4    3.3                                   Avg.Risk  5.0    4.4                                2X Avg.Risk  9.6    7.1                                3X Avg.Risk 23.4   11.0  Estimated CHD Risk  < 0.5  < 0.5 CM  < 0.5 CM  < 0.5 CM  < 0.5 CM    Comment: The CHD Risk is based on the T. Chol/HDL ratio. Other factors affect CHD Risk such as hypertension, smoking, diabetes, severe obesity, and family history of premature CHD.  TSH 2.190 1.800 2.740 1.540 1.760    T4, Total 6.0 5.4 6.3 5.7 5.5    T3 Uptake Ratio 28 29 30 31 28     Free Thyroxine Index 1.7 1.6 1.9 1.8 1.5    Prostate Specific Ag, Serum 0.6 0.6 CM 0.3 CM 0.4 CM 0.5 CM     Comment: Roche ECLIA methodology. According to the American Urological Association, Serum PSA should decrease and remain at undetectable levels after radical prostatectomy. The AUA defines biochemical recurrence as an initial PSA value 0.2 ng/mL or greater followed by a subsequent confirmatory PSA value 0.2 ng/mL or greater. Values obtained with different assay methods or kits cannot be used interchangeably. Results cannot be interpreted as absolute evidence of the presence or absence of malignant disease.  WBC 5.0 4.5 9.3 8.1 10.5 10.3   RBC 4.40 4.26 4.52 4.38 4.31 4.36   Hemoglobin 15.3 14.3 15.3 15.1 14.8 14.9   Hematocrit 46.2 42.5 44.2 42.7 43.0 42.9   MCV 105 High  100 High  98 High  98 High  100 High  98 High    MCH 34.8 High  33.6 High  33.8 High  34.5 High  34.3 High  34.2 High    MCHC 33.1 33.6 34.6 35.4 34.4 34.7   RDW 13.2 12.8 12.4 12.7 12.7 12.4   Platelets 192 156 233 225 248 237   Neutrophils 38 47 67 63 66 66   Lymphs 51 41 23 26 25 23    Monocytes 8 8 7 6 5 6    Eos 2 3 2 4 3 4    Basos 1 1 1 1 1 1    Neutrophils Absolute 1.9 2.1 6.3 5.1 7.0 6.9   Lymphocytes Absolute 2.5 1.8 2.1 2.1 2.6 2.3   Monocytes Absolute 0.4 0.4 0.7 0.5 0.5 0.6   EOS (ABSOLUTE) 0.1 0.2 0.2 0.3 0.3 0.4   Basophils Absolute 0.1 0.0 0.1 0.0 0.1 0.1   Immature Granulocytes 0 0 0 0  0 0   Immature Grans (Abs) 0.0 0.0 0.0 0.0 0.0 0.0                    ____________________________________________  EKG  Sinus rhythm at 61 bpm.  Short PR syndrome ____________________________________________    ____________________________________________   INITIAL IMPRESSION / ASSESSMENT AND PLAN As part of my medical decision making, I reviewed the following data within the electronic MEDICAL RECORD NUMBER      No acute findings on physical exam except for elevated blood pressure.  No acute findings on EKG or labs.  Due to patient complaint of palpitation will consult to cardiology for definitive  evaluation.  Patient given a prescription for lisinopril  and will follow-up status post cardiology evaluation.        ____________________________________________   FINAL CLINICAL IMPRESSION  Annual physical exam with findings of hypertension.   ED Discharge Orders          Ordered    EKG 12-Lead        06/29/24 1316             Note:  This document was prepared using Dragon voice recognition software and may include unintentional dictation errors.

## 2024-07-04 NOTE — Progress Notes (Signed)
 Pt presents today to complete physical, Pt has had high BP for several days while having some heart palpitations. Pt BP hovering around 180/90.

## 2024-07-06 ENCOUNTER — Encounter: Payer: Self-pay | Admitting: Physician Assistant

## 2024-07-29 ENCOUNTER — Other Ambulatory Visit: Payer: Self-pay | Admitting: Physician Assistant

## 2024-07-31 ENCOUNTER — Other Ambulatory Visit: Payer: Self-pay

## 2024-07-31 DIAGNOSIS — I10 Essential (primary) hypertension: Secondary | ICD-10-CM

## 2024-08-07 NOTE — Progress Notes (Unsigned)
 Cardiology Office Note  Date:  08/08/2024   ID:  Samuel Stevenson, DOB Jun 16, 1971, MRN [id number]  PCP:  Reynaldo Rav, MD   Chief Complaint  Patient presents with   New Patient (Initial Visit)    Ref by Tanda Sharps, PA-C for HTN. Patient c/o chest pressure, fluttering in chest with feeling sluggish with elevated blood pressure x 2 months ago. The patient was started on Lisinopril  about 2 months ago and the chest pressure and shortness of breath as subsided since.     HPI:  Samuel Stevenson a 53 y.o. 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[employer], Does side jobs, driving  Reports at the end of September he was appreciating chest tightness and some fluttering If he slow down his pace of activity, symptoms would improve Went for physical with Tanda Sharps Was started on lisinopril  10 mg daily for high blood pressure Does not check his blood pressure at home, does not have a blood pressure cuff  Feels his symptoms of chest tightness and fluttering have resolved on the lisinopril   Discussed smoking cessation Tried chantix in the past, this worked for him but it got expensive  Family hx: Father: Cad stent 92s Mother: dies 44 MI Brother: valve replacement  EKG personally reviewed by myself on todays visit EKG Interpretation Date/Time:  Tuesday August 08 2024 10:33:33 EDT Ventricular Rate:  96 PR Interval:  120 QRS Duration:  88 QT Interval:  346 QTC Calculation: 437 R Axis:   69  Text Interpretation: Normal sinus rhythm Normal ECG No previous ECGs available Confirmed by Perla Lye [id number]) on 08/08/2024 10:58:12 AM    PMH:   has a past medical history of Allergy, Asthma, ED (erectile dysfunction), Elevated glucose, Epicondylitis, lateral, right (2012), Hyperlipidemia, Overweight, and Plantar fasciitis.  PSH:    Past Surgical  History:  Procedure Laterality Date   BACK SURGERY  2018   L4-L5    Current Outpatient Medications  Medication Sig Dispense Refill   lisinopril  (ZESTRIL ) 10 MG tablet TAKE 1 TABLET BY MOUTH DAILY. 30 tablet 0   naproxen sodium (ALEVE) 220 MG tablet Take 440 mg by mouth daily as needed.     sildenafil  (REVATIO ) 20 MG tablet TAKE 1-3 TABLETS BY MOUTH 30 MINUTES TO 4 HOURS PRIOR TO SEXUAL INTERCOURSE ONCE DAILY AS NEEDED 30 tablet 5   No current facility-administered medications for this visit.     Allergies:   Patient has no known allergies.   Social History:  The patient  reports that he has been smoking cigarettes. He started smoking about 40 years ago. He has a 40 pack-year smoking history. He has never used smokeless tobacco. He reports that he does not currently use alcohol after a past usage of about 12.0 standard drinks of alcohol per week. He reports that he does not use drugs.   Family History:   family history includes Heart attack (age of onset: 58) in his mother; Heart disease in his father; Hyperlipidemia in his mother; Hypertension in his mother; Valvular heart disease in his brother.    Review of Systems: Review of Systems  Constitutional: Negative.   HENT: Negative.    Respiratory: Negative.    Cardiovascular: Negative.   Gastrointestinal: Negative.   Musculoskeletal: Negative.   Neurological: Negative.   Psychiatric/Behavioral: Negative.    All other systems reviewed and are negative.   PHYSICAL EXAM:  VS:  BP (!) 140/78 (BP Location: Right Arm, Patient Position: Sitting, Cuff Size: Normal)   Pulse 96   Ht 5' 11 (1.803 m)   Wt 189 lb 6 oz (85.9 kg)   SpO2 99%   BMI 26.41 kg/m  , BMI Body mass index is 26.41 kg/m. GEN: Well nourished, well developed, in no acute distress HEENT: normal Neck: no JVD, carotid bruits, or masses Cardiac: RRR; no murmurs, rubs, or gallops,no edema  Respiratory:  clear to auscultation bilaterally, normal work of breathing GI:  soft, nontender, nondistended, + BS MS: no deformity or atrophy Skin: warm and dry, no rash Neuro:  Strength and sensation are intact Psych: euthymic mood, full affect  Recent Labs: 06/29/2024: ALT 33; BUN 5; Creatinine, Ser 0.67; Hemoglobin 15.3; Platelets 192; Potassium 4.6; Sodium 138; TSH 2.190    Lipid Panel Lab Results  Component Value Date   CHOL 199 06/29/2024   HDL 95 06/29/2024   LDLCALC 94 06/29/2024   TRIG 51 06/29/2024    Wt Readings from Last 3 Encounters:  08/08/24 189 lb 6 oz (85.9 kg)  07/04/24 185 lb (83.9 kg)  08/02/23 180 lb (81.6 kg)    ASSESSMENT AND PLAN:  Problem List Items Addressed This Visit       Cardiology Problems   Essential hypertension - Primary   Relevant Orders   EKG 12-Lead (Completed)     Other   Alcohol consumption of more than four drinks per day   Tobacco use disorder, continuous   Essential hypertension Blood pressure improving on lisinopril  10 Recommend he buy a blood pressure cuff and check blood pressures at home Additional medication titration based on home blood pressure numbers  Cardiac risk stratification Reports chest tightness improved, palpitations improved on lisinopril  -Recommend smoking cessation, prescription for Chantix provided - CT calcium score ordered for risk stratification and help guide management For elevated calcium score would push for smoking cessation and aggressive lipid management  Alcohol Cessation recommended  Seen in consultation for Tanda Sharps and will be referred back to his office for ongoing care of the issues detailed above  Signed, Velinda Lunger, M.D., Ph.D. [facility] [place], [place] [phone]

## 2024-08-08 ENCOUNTER — Ambulatory Visit: Attending: Cardiovascular Disease | Admitting: Cardiovascular Disease

## 2024-08-08 ENCOUNTER — Encounter: Payer: Self-pay | Admitting: Cardiovascular Disease

## 2024-08-08 VITALS — BP 140/78 | HR 96 | Ht 71.0 in | Wt 189.4 lb

## 2024-08-08 DIAGNOSIS — Z789 Other specified health status: Secondary | ICD-10-CM | POA: Diagnosis not present

## 2024-08-08 DIAGNOSIS — F17209 Nicotine dependence, unspecified, with unspecified nicotine-induced disorders: Secondary | ICD-10-CM | POA: Diagnosis not present

## 2024-08-08 DIAGNOSIS — Z8342 Family history of familial hypercholesterolemia: Secondary | ICD-10-CM

## 2024-08-08 DIAGNOSIS — I10 Essential (primary) hypertension: Secondary | ICD-10-CM

## 2024-08-08 MED ORDER — VARENICLINE TARTRATE 1 MG PO TABS: 1.0000 mg | ORAL_TABLET | Freq: Two times a day (BID) | ORAL | 4 refills | Status: AC

## 2024-08-08 NOTE — Patient Instructions (Addendum)
 Check blood pressure at home  Medication Instructions:   Chantix twice a day  If you need a refill on your cardiac medications before your next appointment, please call your pharmacy.   Lab work: No new labs needed  Testing/Procedures:   CT coronary calcium score.   - $99 out of pocket cost at the time of your test - Call [phone] to schedule at your convenience.  [facility] [address] [place], [place] [address]   Coronary CalciumScan A coronary calcium scan is an imaging test used to look for deposits of calcium and other fatty materials (plaques) in the inner lining of the blood vessels of the heart (coronary arteries). These deposits of calcium and plaques can partly clog and narrow the coronary arteries without producing any symptoms or warning signs. This puts a person at risk for a heart attack. This test can detect these deposits before symptoms develop. Tell a health care provider about: Any allergies you have. All medicines you are taking, including vitamins, herbs, eye drops, creams, and over-the-counter medicines. Any problems you or family members have had with anesthetic medicines. Any blood disorders you have. Any surgeries you have had. Any medical conditions you have. Whether you are pregnant or may be pregnant. What are the risks? Generally, this is a safe procedure. However, problems may occur, including: Harm to a pregnant woman and her unborn baby. This test involves the use of radiation. Radiation exposure can be dangerous to a pregnant woman and her unborn baby. If you are pregnant, you generally should not have this procedure done. Slight increase in the risk of cancer. This is because of the radiation involved in the test. What happens before the procedure? No preparation is needed for this procedure. What happens during the procedure? You will undress and remove any jewelry around your neck or  chest. You will put on a hospital gown. Sticky electrodes will be placed on your chest. The electrodes will be connected to an electrocardiogram (ECG) machine to record a tracing of the electrical activity of your heart. A CT scanner will take pictures of your heart. During this time, you will be asked to lie still and hold your breath for 2-3 seconds while a picture of your heart is being taken. The procedure may vary among health care providers and hospitals. What happens after the procedure? You can get dressed. You can return to your normal activities. It is up to you to get the results of your test. Ask your health care provider, or the department that is doing the test, when your results will be ready. Summary A coronary calcium scan is an imaging test used to look for deposits of calcium and other fatty materials (plaques) in the inner lining of the blood vessels of the heart (coronary arteries). Generally, this is a safe procedure. Tell your health care provider if you are pregnant or may be pregnant. No preparation is needed for this procedure. A CT scanner will take pictures of your heart. You can return to your normal activities after the scan is done. This information is not intended to replace advice given to you by your health care provider. Make sure you discuss any questions you have with your health care provider. Document Released: 04/09/2008 Document Revised: 08/31/2016 Document Reviewed: 08/31/2016 Elsevier Interactive Patient Education  2017 [employer].   Follow-Up: At [facility], you and your health needs are our priority.  As part of our continuing mission to  provide you with exceptional heart care, we have created designated Provider Care Teams.  These Care Teams include your primary Cardiologist (physician) and Advanced Practice Providers (APPs -  Physician Assistants and Nurse Practitioners) who all work together to provide you with the care you need, when you  need it.  You will need a follow up appointment as needed  Providers on your designated Care Team:   Lonni Meager, NP Bernardino Bring, PA-C Cadence Franchester, [place]  COVID-19 Vaccine Information can be found at: PodExchange.nl For questions related to vaccine distribution or appointments, please email vaccine@[email or web address] .com or call [phone].

## 2024-08-11 ENCOUNTER — Ambulatory Visit
Admission: RE | Admit: 2024-08-11 | Discharge: 2024-08-11 | Disposition: A | Discharge status: Home / Self Care | Payer: Self-pay | Source: Ambulatory Visit | Attending: Cardiovascular Disease | Admitting: Cardiovascular Disease

## 2024-08-11 DIAGNOSIS — Z8342 Family history of familial hypercholesterolemia: Secondary | ICD-10-CM | POA: Insufficient documentation

## 2024-08-12 ENCOUNTER — Ambulatory Visit: Payer: Self-pay | Admitting: Cardiovascular Disease

## 2024-08-14 ENCOUNTER — Other Ambulatory Visit: Payer: Self-pay | Admitting: Emergency Medicine

## 2024-08-14 DIAGNOSIS — Z79899 Other long term (current) drug therapy: Secondary | ICD-10-CM

## 2024-08-14 MED ORDER — ROSUVASTATIN CALCIUM 20 MG PO TABS
20.0000 mg | ORAL_TABLET | Freq: Every day | ORAL | 3 refills | Status: AC
Start: 2024-08-14 — End: 2024-11-12

## 2024-08-14 NOTE — Addendum Note (Signed)
 Addended by: CRISTOPHER OLIVIA PARAS on: 08/14/2024 09:47 AM   Modules accepted: Orders

## 2024-08-30 ENCOUNTER — Other Ambulatory Visit: Payer: Self-pay

## 2024-08-30 MED ORDER — LISINOPRIL 10 MG PO TABS: 10.0000 mg | ORAL_TABLET | Freq: Every day | ORAL | 0 refills | Status: AC

## 2024-09-04 ENCOUNTER — Encounter: Payer: Self-pay | Admitting: Physician Assistant

## 2024-09-04 ENCOUNTER — Ambulatory Visit: Payer: Self-pay | Admitting: Physician Assistant

## 2024-09-04 VITALS — BP 151/80 | HR 81

## 2024-09-04 DIAGNOSIS — R052 Subacute cough: Secondary | ICD-10-CM

## 2024-09-04 DIAGNOSIS — G4709 Other insomnia: Secondary | ICD-10-CM

## 2024-09-04 DIAGNOSIS — Z716 Tobacco abuse counseling: Secondary | ICD-10-CM

## 2024-09-04 DIAGNOSIS — Z23 Encounter for immunization: Secondary | ICD-10-CM

## 2024-09-04 MED ORDER — TRAZODONE HCL 150 MG PO TABS: 150.0000 mg | ORAL_TABLET | Freq: Every day | ORAL | 0 refills | Status: AC

## 2024-09-04 MED ORDER — PSEUDOEPH-BROMPHEN-DM 30-2-10 MG/5ML PO SYRP: 5.0000 mL | ORAL_SOLUTION | Freq: Four times a day (QID) | ORAL | 0 refills | Status: DC | PRN

## 2024-09-04 NOTE — Progress Notes (Signed)
 PT PRESENTS TODAY FOR FOLLOW UP AFTER CARDIOLOGY APT.  PT WILL RECEIVE FLU SHOT AFTER APT.

## 2024-09-04 NOTE — Progress Notes (Signed)
   Subjective: Follow-up for cardiology    Patient ID: Samuel Stevenson, male    DOB: 07/20/1971, 53 y.o.   MRN: [id number]  HPI Patient 1 month follow-up with cardiology secondary to elevated blood pressure and chest pain.  Cardiology is recommending that the patient continue lisinopril  and start Chantix for smoking cessation.  Patient has noticed a decrease in smoking.  Patient is currently taking 10 mg of lisinopril .  Patient took doses 1 hour prior to arrival to clinic.  Patient also voiced complaint of insomnia.  Patient states he wakes up 2-3 times per night.   Review of Systems Hypertension    Objective:   Physical Exam BP 148/95 151/80  Pulse Rate 80 81  No acute distress.   HEENT is remarkable postnasal drainage. Neck is supple for lymphadenopathy or bruits. Lungs are clear to auscultation.  Patient has a nonproductive cough throughout visit. Heart is regular rate and rhythm.     Assessment & Plan: Hypertension and insomnia.   Patient advised to continue lisinopril  and Chantix.  Patient given prescription for trazodone 150 mg nightly.  Patient given prescription for Bromfed-DM.  Patient will follow-up in 1 month.

## 2024-10-02 ENCOUNTER — Encounter: Payer: Self-pay | Admitting: Physician Assistant

## 2024-10-02 ENCOUNTER — Ambulatory Visit: Payer: Self-pay | Admitting: Physician Assistant

## 2024-10-02 VITALS — BP 150/72 | HR 88 | Temp 97.2°F | Resp 12

## 2024-10-02 DIAGNOSIS — R052 Subacute cough: Secondary | ICD-10-CM

## 2024-10-02 DIAGNOSIS — G4709 Other insomnia: Secondary | ICD-10-CM

## 2024-10-02 MED ORDER — BENZONATATE 200 MG PO CAPS: 200.0000 mg | ORAL_CAPSULE | Freq: Three times a day (TID) | ORAL | 0 refills | Status: AC | PRN

## 2024-10-02 NOTE — Progress Notes (Signed)
   Subjective: Cough and insomnia    Patient ID: Samuel Stevenson, male    DOB: 03-Jan-1971, 53 y.o.   MRN: [id number]  HPI Patient complains of nonproductive coughing spells that have increasing in the last 6 to 8 weeks.  Refractory to over-the-counter cough suppressants.  Patient is a long-term lisinopril  using for blood pressure.  Patient notes increase sleeping pattern status post starting trazodone .  Patient also has a decreased tobacco use since starting Chantix .  States no cigarette in the past week.   Review of Systems Hypertension, insomnia, and tobacco use disorder.    Objective:   Physical Exam BP 150/72  BP Location Left Arm  Patient Position Sitting  Cuff Size Large  Pulse Rate 88  Temp 97.2 F (36.2 C)  Temp Source Temporal  Resp 12  SpO2 100 %  HEENT is unremarkable. Neck is supple lymphadenectomy. Lungs are clear to auscultation. Heart regular rate and rhythm.       Assessment & Plan: Cough and insomnia.   Advised patient to stop taking lisinopril  for 1 week.  Continue trazodone  and Chantix .  Follow-up in 1 week for reevaluation of cough.

## 2024-10-02 NOTE — Progress Notes (Signed)
 Still taking Chantix  States hasn't had a cigarette since last Wednesday.

## 2024-10-04 ENCOUNTER — Ambulatory Visit

## 2024-10-09 ENCOUNTER — Encounter: Payer: Self-pay | Admitting: Physician Assistant

## 2024-10-09 ENCOUNTER — Ambulatory Visit: Payer: Self-pay | Admitting: Physician Assistant

## 2024-10-09 VITALS — BP 169/76 | HR 81 | Resp 16 | Ht 71.0 in | Wt 190.0 lb

## 2024-10-09 DIAGNOSIS — T464X5A Adverse effect of angiotensin-converting-enzyme inhibitors, initial encounter: Secondary | ICD-10-CM

## 2024-10-09 MED ORDER — AMLODIPINE BESYLATE 5 MG PO TABS: 5.0000 mg | ORAL_TABLET | Freq: Every day | ORAL | 0 refills | Status: DC

## 2024-10-09 NOTE — Progress Notes (Signed)
° °  Subjective: Cough    Patient ID: Samuel Stevenson, male    DOB: 02/07/1971, 53 y.o.   MRN: [id number]  HPI Patient presents today for follow-up with nonproductive cough for few months.  Lisinopril  is suspected being the causative agent.  Patient decreased the medication on 10/02/2024.  No other palliative measure for complaint.  States cough is almost resolved.  Blood pressure elevated on today's visit at 169/76.   Review of Systems Hypertension    Objective:   Physical Exam  10/09/2024   9:53 AM 10:02 AM  BP 168/82 169/76  Cuff Size Normal --  Pulse Rate 80 81  Weight 190 lb (86.2 kg) --  Height 5' 11 (1.803 m) --  Resp 16 --  SpO2 98 %   HEENT is unremarkable. Neck is supple for lymphadenopathy or bruits. Lungs clear to auscultation.  No cough throughout exam. Heart regular rate and rhythm.       Assessment & Plan: Cough  Patient given a prescription for 5 mg of Norvasc  will follow-up in 1 week.  Sooner if condition worsens.

## 2024-10-09 NOTE — Progress Notes (Signed)
 Pt presents today for follow up on BP.

## 2024-10-16 ENCOUNTER — Ambulatory Visit: Payer: Self-pay | Admitting: Physician Assistant

## 2024-10-16 ENCOUNTER — Encounter: Payer: Self-pay | Admitting: Physician Assistant

## 2024-10-16 VITALS — BP 144/74 | Resp 16 | Wt 190.0 lb

## 2024-10-16 DIAGNOSIS — I10 Essential (primary) hypertension: Secondary | ICD-10-CM

## 2024-10-16 MED ORDER — AMLODIPINE BESYLATE 5 MG PO TABS: 5.0000 mg | ORAL_TABLET | Freq: Every day | ORAL | 3 refills | Status: AC

## 2024-10-16 NOTE — Progress Notes (Signed)
 Pt presents today for follow up on BP and cough. BP is doing good and cough is gone. Samuel Stevenson

## 2024-10-16 NOTE — Progress Notes (Signed)
" ° °  Subjective: Hypertension    Patient ID: Samuel Stevenson, male    DOB: Aug 13, 1971, 53 y.o.   MRN: [id number]  HPI Patient is follow-up for ACE inhibitor cough.  Patient discontinue lisinopril  and started on Norvasc  at 5 mg daily.  Patient's cough has resolved.  Patient blood pressure within normal range.  Patient advise to continue Chantix  for tobacco cessation.   Review of Systems Hypertension    Objective:   Physical Exam BP 144/74  Cuff Size Normal  Weight 190 lb (86.2 kg)  Resp 16  SpO2 100 %   BMI: 26.50 kg/m2  BSA: 2.08 m2         Assessment & Plan: Hypertension   Advised to continue Norvasc  at 5 mg daily and follow-up in 3 months. "

## 2024-11-07 ENCOUNTER — Other Ambulatory Visit: Payer: Self-pay | Admitting: Physician Assistant

## 2024-11-07 DIAGNOSIS — N529 Male erectile dysfunction, unspecified: Secondary | ICD-10-CM

## 2024-11-16 ENCOUNTER — Encounter: Payer: Self-pay | Admitting: Emergency Medicine
